# Patient Record
Sex: Female | Born: 1990 | Race: Black or African American | Hispanic: No | Marital: Married | Smoking: Never smoker
Health system: Southern US, Community
[De-identification: ages and names within clinical notes are randomized; demographics above are authoritative.]

## PROBLEM LIST (undated history)

## (undated) ENCOUNTER — Inpatient Hospital Stay (HOSPITAL_COMMUNITY): Payer: Self-pay

## (undated) DIAGNOSIS — M84376A Stress fracture, unspecified foot, initial encounter for fracture: Secondary | ICD-10-CM

## (undated) HISTORY — PX: WISDOM TOOTH EXTRACTION: SHX21

## (undated) HISTORY — DX: Stress fracture, unspecified foot, initial encounter for fracture: M84.376A

---
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
## 2010-03-02 DIAGNOSIS — M84376A Stress fracture, unspecified foot, initial encounter for fracture: Secondary | ICD-10-CM

## 2010-03-02 HISTORY — DX: Stress fracture, unspecified foot, initial encounter for fracture: M84.376A

## 2010-03-23 ENCOUNTER — Encounter: Payer: Self-pay | Admitting: Family Medicine

## 2010-10-30 ENCOUNTER — Emergency Department (HOSPITAL_COMMUNITY)
Admission: EM | Admit: 2010-10-30 | Discharge: 2010-10-30 | Disposition: A | Discharge status: Home / Self Care | Payer: Self-pay | Attending: Emergency Medicine | Admitting: Emergency Medicine

## 2010-10-30 DIAGNOSIS — I10 Essential (primary) hypertension: Secondary | ICD-10-CM | POA: Insufficient documentation

## 2010-10-30 DIAGNOSIS — L298 Other pruritus: Secondary | ICD-10-CM | POA: Insufficient documentation

## 2010-10-30 DIAGNOSIS — L509 Urticaria, unspecified: Secondary | ICD-10-CM | POA: Insufficient documentation

## 2010-10-30 DIAGNOSIS — T7840XA Allergy, unspecified, initial encounter: Secondary | ICD-10-CM | POA: Insufficient documentation

## 2010-10-30 DIAGNOSIS — L2989 Other pruritus: Secondary | ICD-10-CM | POA: Insufficient documentation

## 2011-08-02 ENCOUNTER — Emergency Department (HOSPITAL_COMMUNITY)
Admission: EM | Admit: 2011-08-02 | Discharge: 2011-08-02 | Disposition: A | Discharge status: Home / Self Care | Payer: Self-pay | Source: Home / Self Care | Attending: Emergency Medicine | Admitting: Emergency Medicine

## 2011-08-02 ENCOUNTER — Encounter (HOSPITAL_COMMUNITY): Payer: Self-pay

## 2011-08-02 DIAGNOSIS — T148XXA Other injury of unspecified body region, initial encounter: Secondary | ICD-10-CM

## 2011-08-02 MED ORDER — IBUPROFEN 800 MG PO TABS
ORAL_TABLET | ORAL | Status: AC
  Filled 2011-08-02: qty 1

## 2011-08-02 MED ORDER — IBUPROFEN 800 MG PO TABS: 800.0000 mg | ORAL_TABLET | Freq: Once | ORAL | Status: DC

## 2011-08-02 MED ORDER — IBUPROFEN 800 MG PO TABS: 800.0000 mg | ORAL_TABLET | Freq: Three times a day (TID) | ORAL | Status: AC

## 2011-08-02 NOTE — ED Provider Notes (Signed)
History     CSN: [id number]  Arrival date & time 08/02/11  1859   First MD Initiated Contact with Patient 08/02/11 1952      Chief Complaint  Patient presents with  . Muscle Pain    (Consider location/radiation/quality/duration/timing/severity/associated sxs/prior treatment) HPI Comments: Pt was dancing at church today, afterward, L chest began hurting.  Hurts if she takes a deep breath or pushes on the area.  Reports pain feels like it is on the outside, not inside her chest  Patient is a 21 y.o. female presenting with musculoskeletal pain. The history is provided by the patient.  Muscle Pain This is a new problem. The current episode started 6 to 12 hours ago. The problem occurs constantly. The problem has not changed since onset.Associated symptoms include chest pain. Pertinent negatives include no shortness of breath. The symptoms are aggravated by exertion (deep breathing, touching the area). The symptoms are relieved by nothing. She has tried nothing for the symptoms.    History reviewed. No pertinent past medical history.  History reviewed. No pertinent past surgical history.  History reviewed. No pertinent family history.  History  Substance Use Topics  . Smoking status: Never Smoker   . Smokeless tobacco: Not on file  . Alcohol Use: Yes    OB History    Grav Para Term Preterm Abortions TAB SAB Ect Mult Living                  Review of Systems  Constitutional: Negative for fever and chills.  Respiratory: Negative for cough, chest tightness, shortness of breath and wheezing.   Cardiovascular: Positive for chest pain.  Musculoskeletal:       Muscle chest pain    Allergies  Review of patient's allergies indicates no known allergies.  Home Medications   Current Outpatient Rx  Name Route Sig Dispense Refill  . IBUPROFEN 800 MG PO TABS Oral Take 1 tablet (800 mg total) by mouth 3 (three) times daily. 21 tablet 0    BP 121/71  Pulse 70  Temp(Src) 98.6 F  (37 C) (Oral)  Resp 18  SpO2 97%  LMP 07/26/2011  Physical Exam  Constitutional: She appears well-developed and well-nourished. No distress.  Cardiovascular: Normal rate and regular rhythm.   Pulmonary/Chest: Effort normal and breath sounds normal. She exhibits tenderness.      ED Course  Procedures (including critical care time)  Labs Reviewed - No data to display No results found.   1. Muscle strain       MDM          Cathlyn Parsons, NP 08/02/11 1955

## 2011-08-02 NOTE — ED Notes (Signed)
Pt states she was dancing at church today and now is having rt sided chest discomfort when stretching or moving.

## 2011-08-02 NOTE — Discharge Instructions (Signed)
Try soaking in a warm bath with epsom salt or making a warm compress with warm water and epsom salt at least 2 times a day to help relieve the pain and swelling.  Do gentle stretches until the muscle heals.   Muscle Strain A muscle strain (pulled muscle) happens when a muscle is over-stretched. Recovery usually takes 5 to 6 weeks.  HOME CARE   Put ice on the injured area.   Put ice in a plastic bag.   Place a towel between your skin and the bag.   Leave the ice on for 15 to 20 minutes at a time, every hour for the first 2 days.   Do not use the muscle for several days or until your doctor says you can. Do not use the muscle if you have pain.   Wrap the injured area with an elastic bandage for comfort. Do not put it on too tightly.   Only take medicine as told by your doctor.   Warm up before exercise. This helps prevent muscle strains.  GET HELP RIGHT AWAY IF:  There is increased pain or puffiness (swelling) in the affected area. MAKE SURE YOU:   Understand these instructions.   Will watch your condition.   Will get help right away if you are not doing well or get worse.  Document Released: 11/26/2007 Document Revised: 02/05/2011 Document Reviewed: 11/26/2007 [facility] Patient Information 2012 [place], [place].

## 2011-08-02 NOTE — ED Provider Notes (Signed)
Medical screening examination/treatment/procedure(s) were performed by non-physician practitioner and as supervising physician I was immediately available for consultation/collaboration.  Leslee Home, M.D.   Reuben Likes, MD 08/02/11 2033

## 2012-02-01 ENCOUNTER — Other Ambulatory Visit (HOSPITAL_COMMUNITY)
Admission: RE | Admit: 2012-02-01 | Discharge: 2012-02-01 | Disposition: A | Discharge status: Home / Self Care | Payer: BC Managed Care – PPO | Source: Ambulatory Visit | Attending: Medical | Admitting: Medical

## 2012-02-01 ENCOUNTER — Ambulatory Visit (INDEPENDENT_AMBULATORY_CARE_PROVIDER_SITE_OTHER): Payer: BC Managed Care – PPO | Admitting: Medical

## 2012-02-01 ENCOUNTER — Encounter: Payer: Self-pay | Admitting: Medical

## 2012-02-01 VITALS — BP 112/70 | HR 68 | Temp 98.3°F | Resp 16 | Ht 67.0 in | Wt 194.0 lb

## 2012-02-01 DIAGNOSIS — IMO0002 Reserved for concepts with insufficient information to code with codable children: Secondary | ICD-10-CM

## 2012-02-01 DIAGNOSIS — Z01419 Encounter for gynecological examination (general) (routine) without abnormal findings: Secondary | ICD-10-CM | POA: Insufficient documentation

## 2012-02-01 DIAGNOSIS — Z3201 Encounter for pregnancy test, result positive: Secondary | ICD-10-CM

## 2012-02-01 DIAGNOSIS — R6889 Other general symptoms and signs: Secondary | ICD-10-CM

## 2012-02-01 DIAGNOSIS — Z0000 Encounter for general adult medical examination without abnormal findings: Secondary | ICD-10-CM

## 2012-02-01 DIAGNOSIS — Z113 Encounter for screening for infections with a predominantly sexual mode of transmission: Secondary | ICD-10-CM

## 2012-02-01 DIAGNOSIS — Z124 Encounter for screening for malignant neoplasm of cervix: Secondary | ICD-10-CM

## 2012-02-01 DIAGNOSIS — N760 Acute vaginitis: Secondary | ICD-10-CM | POA: Insufficient documentation

## 2012-02-01 LAB — CBC WITH DIFFERENTIAL/PLATELET
Basophils Absolute: 0 10*3/uL (ref 0.0–0.1)
Basophils Relative: 1 % (ref 0–1)
Eosinophils Absolute: 0.2 10*3/uL (ref 0.0–0.7)
Eosinophils Relative: 2 % (ref 0–5)
HCT: 41.1 % (ref 36.0–46.0)
Hemoglobin: 13.9 g/dL (ref 12.0–15.0)
MCHC: 33.8 g/dL (ref 30.0–36.0)
MCV: 89.2 fL (ref 78.0–100.0)
Monocytes Relative: 8 % (ref 3–12)
Platelets: 302 10*3/uL (ref 150–400)

## 2012-02-01 LAB — POCT URINALYSIS DIPSTICK
Bilirubin, UA: NEGATIVE
Blood, UA: NEGATIVE
Leukocytes, UA: NEGATIVE
Protein, UA: NEGATIVE
Spec Grav, UA: 1.01
Urobilinogen, UA: NEGATIVE

## 2012-02-01 LAB — RPR

## 2012-02-01 NOTE — Progress Notes (Signed)
Subjective:   HPI  Kelly Koch is a 21 y.o. female who presents for a complete physical.  New patient today.  Was seeing different primary care prior, wanted to change practices.  Preventative care: Last ophthalmology visit: last visit a few years ago, doesn't wear corrective lenses Last dental visit:yes, regularly Last colonoscopy:n/a Last mammogram:n/a Last gynecological exam:05/2010, last pap 05/2010, abnormal, didn't f/u due to fear of the unknown Last EKG:n/a Last labs:n/a  Prior vaccinations: TD or Tdap:2011 Influenza: no Pneumococcal:n/a Shingles/Zostavax:n/a  Concerns: She notes that she missed her period in November.  Not on birth control.  Was on OCP tablets prior, but stopped in July 2012.  Stopped as she wasn't sexually active at that time.  Sexually active currently.   Uses condoms some times.  Been with current partner x 2 years.   No prior pregnancies.  No hx/o STDs.  No prior STD screening.  Has had 2 sexual partners total.   LMP 12/10/2011, 2 home pregnancy tests recently not clear, seemed positive but not sure.  She doesn't think she is ready to have a child at this time.  Not sure she would want to complete the pregnancy.  Reviewed their medical, surgical, family, social, medication, and allergy history and updated chart as appropriate.   Past Medical History  Diagnosis Date  . Stress fracture of foot 2012    left; healed with use of boot, no cast    Past Surgical History  Procedure Date  . Wisdom tooth extraction     Family History  Problem Relation Age of Onset  . Cancer Father 64    died of cancer; lung?  . Cancer Maternal Grandmother     lung  . Diabetes Neg Hx   . Heart disease Neg Hx   . Stroke Neg Hx   . Hypertension Neg Hx   . Hyperlipidemia Neg Hx     History   Social History  . Marital Status: Single    Spouse Name: N/A    Number of Children: N/A  . Years of Education: N/A   Occupational History  . Not on file.   Social  History Main Topics  . Smoking status: Never Smoker   . Smokeless tobacco: Not on file  . Alcohol Use: Yes     Comment: occasional  . Drug Use: No  . Sexually Active:    Other Topics Concern  . 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  Lives with mother and sister and step-father    No current outpatient prescriptions on file prior to visit.    No Known Allergies   Review of Systems Constitutional: -fever, -chills, -sweats, +unexpected weight change, has gained some weight in the last few months, -decreased appetite, +fatigue Allergy: -sneezing, -itching, -congestion Dermatology: -changing moles, --rash, -lumps ENT: -runny nose, -ear pain, -sore throat, -hoarseness, -sinus pain, -teeth pain, - ringing in ears, -hearing loss, -nosebleeds Cardiology: -chest pain, -palpitations, -swelling, -difficulty breathing when lying flat, -waking up short of breath Respiratory: -cough, -shortness of breath, -difficulty breathing with exercise or exertion, -wheezing, -coughing up blood Gastroenterology: -abdominal pain, -nausea, -vomiting, -diarrhea, -constipation, -blood in stool, -changes in bowel movement, -difficulty swallowing or eating Hematology: -bleeding, -bruising  Musculoskeletal: +joint aches, -muscle aches, -joint swelling, -back pain, -neck pain, -cramping, -changes in gait Ophthalmology: denies vision changes, eye redness, itching, discharge Urology: -burning with urination, -difficulty urinating, -blood in  urine, -urinary frequency, -urgency, -incontinence Neurology: -headache, -weakness, -tingling, -numbness, -memory loss, -falls, -dizziness Psychology: -depressed mood, -agitation, -sleep problems     Objective:   Physical Exam  Nurse notes and vitals reviewed.  General appearance: alert, no distress, WD/WN, AA female Skin: few scattered benign appearing macules, no worrisome  lesions HEENT: normocephalic, conjunctiva/corneas normal, sclerae anicteric, PERRLA, EOMi, nares patent, no discharge or erythema, pharynx normal Oral cavity: MMM, tongue normal, teeth in good repair Neck: supple, no lymphadenopathy, no thyromegaly, no masses, normal ROM, no bruits Chest: non tender, normal shape and expansion Heart: RRR, normal S1, S2, no murmurs Lungs: CTA bilaterally, no wheezes, rhonchi, or rales Abdomen: +bs, soft, non tender, non distended, no masses, no hepatomegaly, no splenomegaly, no bruits Back: non tender, normal ROM, no scoliosis Musculoskeletal: upper extremities non tender, no obvious deformity, normal ROM throughout, lower extremities non tender, no obvious deformity, normal ROM throughout Extremities: no edema, no cyanosis, no clubbing Pulses: 2+ symmetric, upper and lower extremities, normal cap refill Neurological: alert, oriented x 3, CN2-12 intact, strength normal upper extremities and lower extremities, sensation normal throughout, DTRs 2+ throughout, no cerebellar signs, gait normal Psychiatric: normal affect, behavior normal, pleasant  Breast: nontender, glandular fibrocystic tissue in lower bilat breast, no skin changes, no nipple discharge or inversion, no axillary lymphadenopathy Gyn: Normal external genitalia without lesions, vagina with normal mucosa, cervix with mild erythema and somewhat yellowish round tissue just superior to os, os closed, no cervical motion tenderness, no abnormal vaginal discharge.  Possible palpable adnexa on the right, uterus seems somewhat enlarged.  Pap performed with sterile gloves.  Exam chaperoned by nurse. Rectal: deferred    Assessment and Plan :    Encounter Diagnoses  Name Primary?  . Routine general medical examination at a health care facility Yes  . Positive urine pregnancy test   . Abnormal Pap smear   . Screening for STD (sexually transmitted disease)   . Screening for cervical cancer     Physical exam  - discussed healthy lifestyle, diet, exercise, preventative care, vaccinations, and addressed their concerns.  Handout given.  Declines flu vaccine.  She did complete HPV vaccine series by age 31yo.   Pap and STD screening done today.  She is nonfasting, so lipid not done.  Discussed +urine pregnancy test.  Additional labs today.  discussed options going forward. Advised she consider all options, and we will call with labs and pap results.  Will likely refer to [facility], but await results.

## 2012-02-02 LAB — COMPREHENSIVE METABOLIC PANEL
Albumin: 4.3 g/dL (ref 3.5–5.2)
Alkaline Phosphatase: 55 U/L (ref 39–117)
BUN: 7 mg/dL (ref 6–23)
CO2: 24 mEq/L (ref 19–32)
Glucose, Bld: 85 mg/dL (ref 70–99)
Sodium: 137 mEq/L (ref 135–145)
Total Bilirubin: 0.4 mg/dL (ref 0.3–1.2)
Total Protein: 6.6 g/dL (ref 6.0–8.3)

## 2012-02-02 LAB — TSH: TSH: 1.811 u[IU]/mL (ref 0.350–4.500)

## 2012-03-02 NOTE — L&D Delivery Note (Signed)
After admission the pt was started on Pitocin. She had little response to this and it was stopped. She was given 3 Cytotecs and then restarted on Pitocin. Once in the active phase she progressed along a normal labor curve. She pushed for 2 hours. While pushing she developed a fever which was txd with Tylenol and Ampicillin. The baby was OP. It was manually rotated to OA and then delivered with the second contraction over an intact perineum. Baby was delivered ROA. Placenta S/I. EBL-400cc. Baby to NBN.

## 2012-03-03 ENCOUNTER — Telehealth: Payer: Self-pay | Admitting: Internal Medicine

## 2012-03-04 ENCOUNTER — Encounter: Payer: Self-pay | Admitting: Medical

## 2012-03-04 NOTE — Telephone Encounter (Signed)
Patient came by the office and pick up her letter. I called over to [facility] to set the appointment and Rep. York Spaniel that they will need to verify her pregnancy insurance benefits and then they will call her to schedule the appointment. CLS

## 2012-03-04 NOTE — Telephone Encounter (Signed)
Kelly Koch - pls send her the letter.  i printed it up front.   pls refer ASAP to gyn wherever you can get her in the soonest.  Let them know that she was originally unsure whether she was going to proceed with the pregnancy.  I don't know what her plans are at this time.   I know she is behind in prenatal care though.

## 2012-04-23 ENCOUNTER — Inpatient Hospital Stay (HOSPITAL_COMMUNITY)
Admission: AD | Admit: 2012-04-23 | Discharge: 2012-04-23 | Disposition: A | Discharge status: Home / Self Care | Payer: BC Managed Care – PPO | Source: Ambulatory Visit | Attending: Family Medicine | Admitting: Family Medicine

## 2012-04-23 ENCOUNTER — Encounter (HOSPITAL_COMMUNITY): Payer: Self-pay | Admitting: Obstetrics and Gynecology

## 2012-04-23 DIAGNOSIS — R109 Unspecified abdominal pain: Secondary | ICD-10-CM | POA: Insufficient documentation

## 2012-04-23 DIAGNOSIS — O219 Vomiting of pregnancy, unspecified: Secondary | ICD-10-CM

## 2012-04-23 DIAGNOSIS — N949 Unspecified condition associated with female genital organs and menstrual cycle: Secondary | ICD-10-CM

## 2012-04-23 DIAGNOSIS — O210 Mild hyperemesis gravidarum: Secondary | ICD-10-CM | POA: Insufficient documentation

## 2012-04-23 LAB — URINALYSIS, ROUTINE W REFLEX MICROSCOPIC
Bilirubin Urine: NEGATIVE
Hgb urine dipstick: NEGATIVE
Leukocytes, UA: NEGATIVE
Nitrite: NEGATIVE
Specific Gravity, Urine: >1.03 — ABNORMAL HIGH (ref 1.005–1.030)
pH: 5.5 (ref 5.0–8.0)

## 2012-04-23 MED ORDER — PROMETHAZINE HCL 12.5 MG PO TABS: 12.5000 mg | ORAL_TABLET | Freq: Four times a day (QID) | ORAL | Status: DC | PRN

## 2012-04-23 MED ORDER — ONDANSETRON 8 MG PO TBDP
8.0000 mg | ORAL_TABLET | Freq: Once | ORAL | Status: AC
  Administered 2012-04-23: 8 mg via ORAL
  Filled 2012-04-23: qty 1

## 2012-04-23 NOTE — MAU Provider Note (Signed)
History     CSN: [id number]  Arrival date and time: 04/23/12 1139   First Provider Initiated Contact with Patient 04/23/12 1235      Chief Complaint  Patient presents with  . Nausea   HPI Kelly Koch is 22 y.o. G1P0 [redacted]w[redacted]d weeks presenting with persistent nausea.  Had vomiting 5 days ago but none since, nausea continues.  Denies vaginal bleeding or discharge.  Medicaid has been approved, waiting for card to begin care with [facility].         Is having intermittent discomfort on the "sides" with movement.  Is sitting in bed eating crackers with vomiting.     Past Medical History  Diagnosis Date  . Stress fracture of foot 2012    left; healed with use of boot, no cast    Past Surgical History  Procedure Laterality Date  . Wisdom tooth extraction      Family History  Problem Relation Age of Onset  . Cancer Father 60    died of cancer; lung?  . Cancer Maternal Grandmother     lung  . Diabetes Neg Hx   . Heart disease Neg Hx   . Stroke Neg Hx   . Hypertension Neg Hx   . Hyperlipidemia Neg Hx     History  Substance Use Topics  . Smoking status: Never Smoker   . Smokeless tobacco: Never Used  . Alcohol Use: Yes     Comment: occasional    Allergies: No Known Allergies  No prescriptions prior to admission    Review of Systems  Constitutional: Negative.   HENT: Negative.   Respiratory: Negative.   Cardiovascular: Negative.   Gastrointestinal: Positive for nausea and abdominal pain (on her sides).  Genitourinary: Negative.        Neg for vaginal bleeding or discharge   Physical Exam   Blood pressure 131/75, pulse 97, temperature 98.2 F (36.8 C), temperature source Oral, resp. rate 18, height 5\' 6"  (1.676 m), weight 196 lb (88.905 kg), last menstrual period 12/10/2011.  Physical Exam  Constitutional: She is oriented to person, place, and time. She appears well-developed and well-nourished. No distress.  HENT:  Head: Normocephalic.  Neck: Normal range  of motion.  Cardiovascular: Normal rate.   Respiratory: Effort normal.  GI: There is tenderness (area of round ligaments).  Fetal heart tones dopplered at 150  Genitourinary:  Not indicated  Neurological: She is alert and oriented to person, place, and time.  Skin: Skin is warm and dry.  Psychiatric: She has a normal mood and affect. Her behavior is normal.   Results for orders placed during the hospital encounter of 04/23/12 (from the past 24 hour(s))  URINALYSIS, ROUTINE W REFLEX MICROSCOPIC     Status: Abnormal   Collection Time    04/23/12 12:00 PM      Result Value Range   Color, Urine YELLOW  YELLOW   APPearance CLEAR  CLEAR   Specific Gravity, Urine >1.030 (*) 1.005 - 1.030   pH 5.5  5.0 - 8.0   Glucose, UA NEGATIVE  NEGATIVE mg/dL   Hgb urine dipstick NEGATIVE  NEGATIVE   Bilirubin Urine NEGATIVE  NEGATIVE   Ketones, ur NEGATIVE  NEGATIVE mg/dL   Protein, ur NEGATIVE  NEGATIVE mg/dL   Urobilinogen, UA 0.2  0.0 - 1.0 mg/dL   Nitrite NEGATIVE  NEGATIVE   Leukocytes, UA NEGATIVE  NEGATIVE    MAU Course  Procedures  MDM Zofran 8mg  ODT given in MAU  Assessment  and Plan  A:  Nausea in pregnancy    Round ligament pain  P:  Rx for Phenergan 12.5mg  tabs      Begin prenatal care as planned with [facility] when card available      BRAT diet    Tylenol prn for round ligament pain KEY,EVE M 04/23/2012, 12:36 PM

## 2012-04-23 NOTE — MAU Provider Note (Signed)
Chart reviewed and agree with management and plan.  

## 2012-04-23 NOTE — MAU Note (Addendum)
Patient presents to MAU with c/o N/V. Patient reports last time she threw up was on Monday. She is waiting for Medicaid pregnancy verification and is planning to then be seen by [facility].  Denies vaginal bleeding, cramping, or LOF.

## 2012-04-23 NOTE — MAU Note (Signed)
Kelly Koch is a G1 at 19 weeks, no prenatal care. She has applied to medicaid, just waiting on her card. She is having a lot of nausea with some abdominal cramping. She is not having any pain now.

## 2012-05-13 ENCOUNTER — Other Ambulatory Visit: Payer: Self-pay

## 2012-08-19 ENCOUNTER — Emergency Department (HOSPITAL_COMMUNITY)
Admission: EM | Admit: 2012-08-19 | Discharge: 2012-08-19 | Disposition: A | Discharge status: Home / Self Care | Payer: Medicaid Other | Source: Home / Self Care | Attending: Emergency Medicine | Admitting: Emergency Medicine

## 2012-08-19 ENCOUNTER — Encounter (HOSPITAL_COMMUNITY): Payer: Self-pay | Admitting: Emergency Medicine

## 2012-08-19 ENCOUNTER — Other Ambulatory Visit (HOSPITAL_COMMUNITY)
Admission: RE | Admit: 2012-08-19 | Discharge: 2012-08-19 | Disposition: A | Discharge status: Home / Self Care | Payer: BC Managed Care – PPO | Source: Ambulatory Visit | Attending: Emergency Medicine | Admitting: Emergency Medicine

## 2012-08-19 DIAGNOSIS — L909 Atrophic disorder of skin, unspecified: Secondary | ICD-10-CM

## 2012-08-19 DIAGNOSIS — L089 Local infection of the skin and subcutaneous tissue, unspecified: Secondary | ICD-10-CM

## 2012-08-19 DIAGNOSIS — L918 Other hypertrophic disorders of the skin: Secondary | ICD-10-CM

## 2012-08-19 DIAGNOSIS — L919 Hypertrophic disorder of the skin, unspecified: Secondary | ICD-10-CM | POA: Insufficient documentation

## 2012-08-19 NOTE — ED Provider Notes (Signed)
Chief Complaint:   Chief Complaint  Patient presents with  . Mass    History of Present Illness:   Kelly Koch is a 22 year old female [redacted] weeks pregnant. She has had a small lesion on her left medial thigh for months. She may have irritated it has gotten larger in size and slightly painful over the past day.  Review of Systems:  Other than noted above, the patient denies any of the following symptoms: Systemic:  No fever, chills, sweats, weight loss, or fatigue. ENT:  No nasal congestion, rhinorrhea, sore throat, swelling of lips, tongue or throat. Resp:  No cough, wheezing, or shortness of breath. Skin:  No rash, itching, nodules, or suspicious lesions.  PMFSH:  Past medical history, family history, social history, meds, and allergies were reviewed.   Physical Exam:   Vital signs:  BP 127/74  Pulse 80  Temp(Src) 98.4 F (36.9 C) (Oral)  Resp 16  SpO2 100%  LMP 12/10/2011 Gen:  Alert, oriented, in no distress. ENT:  Pharynx clear, no intraoral lesions, moist mucous membranes. Lungs:  Clear to auscultation. Skin:  There is a swollen polypoid lesion measuring 4 mm in size on the medial left thigh. This was not erythematous and not draining any pus.  Procedure Note:  Verbal informed consent was obtained from the patient.  Risks and benefits were outlined with the patient.  Patient understands and accepts these risks.  Identity of the patient was confirmed verbally and by armband.    Procedure was performed as follows:  The lesion was prepped with Betadine and alcohol and anesthetized with 1 mL of 2% Xylocaine with epinephrine. The lesion was then snipped off even with the skin and the base was cauterized with electrocautery pad. The specimen was placed in a formalin jar and sent for surgical pathology.  Patient tolerated the procedure well without any immediate complications.   Assessment:  The encounter diagnosis was Inflamed skin tag.  Plan:   1.  The following meds  were prescribed:   Discharge Medication List as of 08/19/2012  5:02 PM     2.  The patient was instructed in symptomatic care and handouts were given. 3.  The patient was told to return if becoming worse in any way, if no better in 3 or 4 days, and given some red flag symptoms such as any sign of infection that would indicate earlier return. 4.  Follow up here if necessary.     Reuben Likes, MD 08/19/12 2051

## 2012-08-19 NOTE — ED Notes (Signed)
At bedside with dr Lorenz Coaster, removed tissue form left inner thigh. Tissue placed in biopsy cup, labeled by this nurse and notified celeste.

## 2012-08-19 NOTE — ED Notes (Signed)
Patient has a bump or large skin tag on inner left thigh.  Patient had noticed this several months ago, but today noticed the increase in size.

## 2012-09-08 ENCOUNTER — Inpatient Hospital Stay (HOSPITAL_COMMUNITY)
Admission: AD | Admit: 2012-09-08 | Discharge: 2012-09-11 | DRG: 372 | Disposition: A | Discharge status: Home / Self Care | Payer: BC Managed Care – PPO | Source: Ambulatory Visit | Attending: Obstetrics & Gynecology | Admitting: Obstetrics & Gynecology

## 2012-09-08 ENCOUNTER — Encounter (HOSPITAL_COMMUNITY): Payer: Self-pay | Admitting: *Deleted

## 2012-09-08 DIAGNOSIS — O4290 Premature rupture of membranes, unspecified as to length of time between rupture and onset of labor, unspecified weeks of gestation: Principal | ICD-10-CM | POA: Diagnosis present

## 2012-09-08 DIAGNOSIS — Z3480 Encounter for supervision of other normal pregnancy, unspecified trimester: Secondary | ICD-10-CM

## 2012-09-08 LAB — CBC
MCH: 29.9 pg (ref 26.0–34.0)
MCHC: 34.9 g/dL (ref 30.0–36.0)
Platelets: 242 10*3/uL (ref 150–400)

## 2012-09-08 LAB — RPR: RPR Ser Ql: NONREACTIVE

## 2012-09-08 MED ORDER — OXYTOCIN 40 UNITS IN LACTATED RINGERS INFUSION - SIMPLE MED
1.0000 m[IU]/min | INTRAVENOUS | Status: DC
  Administered 2012-09-08: 2 m[IU]/min via INTRAVENOUS
  Filled 2012-09-08: qty 1000

## 2012-09-08 MED ORDER — TERBUTALINE SULFATE 1 MG/ML IJ SOLN: 0.2500 mg | Freq: Once | INTRAMUSCULAR | Status: AC | PRN

## 2012-09-08 MED ORDER — OXYTOCIN 40 UNITS IN LACTATED RINGERS INFUSION - SIMPLE MED
62.5000 mL/h | INTRAVENOUS | Status: DC
  Filled 2012-09-08: qty 1000

## 2012-09-08 MED ORDER — OXYTOCIN BOLUS FROM INFUSION: 500.0000 mL | INTRAVENOUS | Status: DC

## 2012-09-08 MED ORDER — LACTATED RINGERS IV SOLN
500.0000 mL | INTRAVENOUS | Status: DC | PRN
  Administered 2012-09-09 (×2): 500 mL via INTRAVENOUS

## 2012-09-08 MED ORDER — IBUPROFEN 600 MG PO TABS: 600.0000 mg | ORAL_TABLET | Freq: Four times a day (QID) | ORAL | Status: DC | PRN

## 2012-09-08 MED ORDER — ONDANSETRON HCL 4 MG/2ML IJ SOLN: 4.0000 mg | Freq: Four times a day (QID) | INTRAMUSCULAR | Status: DC | PRN

## 2012-09-08 MED ORDER — FLEET ENEMA 7-19 GM/118ML RE ENEM: 1.0000 | ENEMA | RECTAL | Status: DC | PRN

## 2012-09-08 MED ORDER — MISOPROSTOL 25 MCG QUARTER TABLET
25.0000 ug | ORAL_TABLET | ORAL | Status: DC | PRN
  Administered 2012-09-08 (×2): 25 ug via VAGINAL
  Filled 2012-09-08: qty 1
  Filled 2012-09-08 (×2): qty 0.25

## 2012-09-08 MED ORDER — ACETAMINOPHEN 325 MG PO TABS
650.0000 mg | ORAL_TABLET | ORAL | Status: DC | PRN
  Administered 2012-09-09: 650 mg via ORAL
  Filled 2012-09-08: qty 2

## 2012-09-08 MED ORDER — BUTORPHANOL TARTRATE 1 MG/ML IJ SOLN
1.0000 mg | INTRAMUSCULAR | Status: DC | PRN
Start: 2012-09-08 — End: 2012-09-09

## 2012-09-08 MED ORDER — LIDOCAINE HCL (PF) 1 % IJ SOLN
30.0000 mL | INTRAMUSCULAR | Status: DC | PRN
  Filled 2012-09-08 (×2): qty 30

## 2012-09-08 MED ORDER — OXYCODONE-ACETAMINOPHEN 5-325 MG PO TABS: 1.0000 | ORAL_TABLET | ORAL | Status: DC | PRN

## 2012-09-08 MED ORDER — LACTATED RINGERS IV SOLN
INTRAVENOUS | Status: DC
  Administered 2012-09-09 (×3): via INTRAVENOUS

## 2012-09-08 MED ORDER — CITRIC ACID-SODIUM CITRATE 334-500 MG/5ML PO SOLN: 30.0000 mL | ORAL | Status: DC | PRN

## 2012-09-08 NOTE — MAU Note (Signed)
Pt reports she felt her water break around 1230. Clear fluid noted. Having some back cramping and pain good fetal movement reported.

## 2012-09-09 ENCOUNTER — Encounter (HOSPITAL_COMMUNITY): Payer: Self-pay | Admitting: Anesthesiology

## 2012-09-09 ENCOUNTER — Inpatient Hospital Stay (HOSPITAL_COMMUNITY): Payer: BC Managed Care – PPO | Admitting: Anesthesiology

## 2012-09-09 MED ORDER — DIBUCAINE 1 % RE OINT: 1.0000 "application " | TOPICAL_OINTMENT | RECTAL | Status: DC | PRN

## 2012-09-09 MED ORDER — EPHEDRINE 5 MG/ML INJ
10.0000 mg | INTRAVENOUS | Status: DC | PRN
  Filled 2012-09-09: qty 4
  Filled 2012-09-09: qty 2

## 2012-09-09 MED ORDER — PHENYLEPHRINE 40 MCG/ML (10ML) SYRINGE FOR IV PUSH (FOR BLOOD PRESSURE SUPPORT)
80.0000 ug | PREFILLED_SYRINGE | INTRAVENOUS | Status: DC | PRN
  Filled 2012-09-09: qty 2

## 2012-09-09 MED ORDER — TETANUS-DIPHTH-ACELL PERTUSSIS 5-2.5-18.5 LF-MCG/0.5 IM SUSP: 0.5000 mL | Freq: Once | INTRAMUSCULAR | Status: DC

## 2012-09-09 MED ORDER — WITCH HAZEL-GLYCERIN EX PADS: 1.0000 "application " | MEDICATED_PAD | CUTANEOUS | Status: DC | PRN

## 2012-09-09 MED ORDER — PHENYLEPHRINE 40 MCG/ML (10ML) SYRINGE FOR IV PUSH (FOR BLOOD PRESSURE SUPPORT)
80.0000 ug | PREFILLED_SYRINGE | INTRAVENOUS | Status: DC | PRN
  Filled 2012-09-09: qty 5
  Filled 2012-09-09: qty 2

## 2012-09-09 MED ORDER — DIPHENHYDRAMINE HCL 50 MG/ML IJ SOLN
12.5000 mg | INTRAMUSCULAR | Status: AC | PRN
  Administered 2012-09-09 (×3): 12.5 mg via INTRAVENOUS
  Filled 2012-09-09 (×2): qty 1

## 2012-09-09 MED ORDER — SIMETHICONE 80 MG PO CHEW: 80.0000 mg | CHEWABLE_TABLET | ORAL | Status: DC | PRN

## 2012-09-09 MED ORDER — ZOLPIDEM TARTRATE 5 MG PO TABS: 5.0000 mg | ORAL_TABLET | Freq: Every evening | ORAL | Status: DC | PRN

## 2012-09-09 MED ORDER — IBUPROFEN 600 MG PO TABS
600.0000 mg | ORAL_TABLET | Freq: Four times a day (QID) | ORAL | Status: DC
  Administered 2012-09-09 – 2012-09-11 (×7): 600 mg via ORAL
  Filled 2012-09-09 (×5): qty 1

## 2012-09-09 MED ORDER — OXYTOCIN 40 UNITS IN LACTATED RINGERS INFUSION - SIMPLE MED
1.0000 m[IU]/min | INTRAVENOUS | Status: DC
  Administered 2012-09-09: 2 m[IU]/min via INTRAVENOUS
  Administered 2012-09-09: 12 m[IU]/min via INTRAVENOUS
  Administered 2012-09-09: 10 m[IU]/min via INTRAVENOUS

## 2012-09-09 MED ORDER — FENTANYL 2.5 MCG/ML BUPIVACAINE 1/10 % EPIDURAL INFUSION (WH - ANES)
14.0000 mL/h | INTRAMUSCULAR | Status: DC | PRN
  Administered 2012-09-09: 14 mL/h via EPIDURAL
  Filled 2012-09-09 (×2): qty 125

## 2012-09-09 MED ORDER — BENZOCAINE-MENTHOL 20-0.5 % EX AERO
1.0000 "application " | INHALATION_SPRAY | CUTANEOUS | Status: DC | PRN
  Administered 2012-09-10: 1 via TOPICAL
  Filled 2012-09-09: qty 56

## 2012-09-09 MED ORDER — LACTATED RINGERS IV SOLN
500.0000 mL | Freq: Once | INTRAVENOUS | Status: AC
  Administered 2012-09-09: 04:00:00 via INTRAVENOUS

## 2012-09-09 MED ORDER — SODIUM CHLORIDE 0.9 % IV SOLN
2.0000 g | Freq: Once | INTRAVENOUS | Status: AC
  Administered 2012-09-09: 2 g via INTRAVENOUS
  Filled 2012-09-09: qty 2000

## 2012-09-09 MED ORDER — LIDOCAINE HCL (PF) 1 % IJ SOLN
INTRAMUSCULAR | Status: DC | PRN
  Administered 2012-09-09 (×2): 4 mL

## 2012-09-09 MED ORDER — MEASLES, MUMPS & RUBELLA VAC ~~LOC~~ INJ: 0.5000 mL | INJECTION | Freq: Once | SUBCUTANEOUS | Status: DC

## 2012-09-09 MED ORDER — EPHEDRINE 5 MG/ML INJ
10.0000 mg | INTRAVENOUS | Status: DC | PRN
  Filled 2012-09-09: qty 2

## 2012-09-09 MED ORDER — ONDANSETRON HCL 4 MG PO TABS: 4.0000 mg | ORAL_TABLET | ORAL | Status: DC | PRN

## 2012-09-09 MED ORDER — OXYCODONE-ACETAMINOPHEN 5-325 MG PO TABS: 1.0000 | ORAL_TABLET | ORAL | Status: DC | PRN

## 2012-09-09 MED ORDER — ONDANSETRON HCL 4 MG/2ML IJ SOLN: 4.0000 mg | INTRAMUSCULAR | Status: DC | PRN

## 2012-09-09 MED ORDER — FENTANYL 2.5 MCG/ML BUPIVACAINE 1/10 % EPIDURAL INFUSION (WH - ANES)
INTRAMUSCULAR | Status: DC | PRN
  Administered 2012-09-09: 14 mL/h via EPIDURAL

## 2012-09-09 NOTE — Anesthesia Procedure Notes (Signed)
Epidural Patient location during procedure: OB Start time: 09/09/2012 4:39 AM  Staffing Anesthesiologist: Evona Westra A. Performed by: anesthesiologist   Preanesthetic Checklist Completed: patient identified, site marked, surgical consent, pre-op evaluation, timeout performed, IV checked, risks and benefits discussed and monitors and equipment checked  Epidural Patient position: sitting Prep: site prepped and draped and DuraPrep Patient monitoring: continuous pulse ox and blood pressure Approach: midline Injection technique: LOR air  Needle:  Needle type: Tuohy  Needle gauge: 17 G Needle length: 9 cm and 9 Needle insertion depth: 8 cm Catheter type: closed end flexible Catheter size: 19 Gauge Catheter at skin depth: 13 cm Test dose: negative  Assessment Events: blood not aspirated, injection not painful, no injection resistance, negative IV test and no paresthesia  Additional Notes Patient identified. Risks and benefits discussed including failed block, incomplete  Pain control, post dural puncture headache, nerve damage, paralysis, blood pressure Changes, nausea, vomiting, reactions to medications-both toxic and allergic and post Partum back pain. All questions were answered. Patient expressed understanding and wished to proceed. Sterile technique was used throughout procedure. Epidural site was Dressed with sterile barrier dressing. No paresthesias, signs of intravascular injection Or signs of intrathecal spread were encountered.  Patient was more comfortable after the epidural was dosed. Please see RN's note for documentation of vital signs and FHR which are stable.

## 2012-09-09 NOTE — Progress Notes (Signed)
Delivery of live viable female by Dr Anderson. APGARS 9,9  

## 2012-09-09 NOTE — Anesthesia Preprocedure Evaluation (Signed)
Anesthesia Evaluation  Patient identified by MRN, date of birth, ID band Patient awake    Reviewed: Allergy & Precautions, H&P , Patient's Chart, lab work & pertinent test results  Airway Mallampati: II TM Distance: >3 FB Neck ROM: full    Dental no notable dental hx. (+) Teeth Intact   Pulmonary neg pulmonary ROS,  breath sounds clear to auscultation  Pulmonary exam normal       Cardiovascular negative cardio ROS  Rhythm:regular Rate:Normal     Neuro/Psych negative neurological ROS  negative psych ROS   GI/Hepatic negative GI ROS, Neg liver ROS,   Endo/Other  Morbid obesity  Renal/GU negative Renal ROS  negative genitourinary   Musculoskeletal   Abdominal   Peds  Hematology negative hematology ROS (+)   Anesthesia Other Findings   Reproductive/Obstetrics (+) Pregnancy                           Anesthesia Physical Anesthesia Plan  ASA: III  Anesthesia Plan: Epidural   Post-op Pain Management:    Induction:   Airway Management Planned:   Additional Equipment:   Intra-op Plan:   Post-operative Plan:   Informed Consent: I have reviewed the patients History and Physical, chart, labs and discussed the procedure including the risks, benefits and alternatives for the proposed anesthesia with the patient or authorized representative who has indicated his/her understanding and acceptance.     Plan Discussed with: Anesthesiologist  Anesthesia Plan Comments:         Anesthesia Quick Evaluation

## 2012-09-09 NOTE — Progress Notes (Addendum)
  Dr Dareen Piano notified of pt pushing progress, length of time pushing, FHR tracing with change in baseline, and maternal temp. Order for anbx.

## 2012-09-10 LAB — CBC
MCH: 30.3 pg (ref 26.0–34.0)
MCV: 86.4 fL (ref 78.0–100.0)
Platelets: 193 10*3/uL (ref 150–400)
RBC: 3.17 MIL/uL — ABNORMAL LOW (ref 3.87–5.11)

## 2012-09-10 NOTE — Progress Notes (Signed)
Patient still considering if she would like TDAP  Vaccine. Information regarding vaccine given to patient.

## 2012-09-10 NOTE — Progress Notes (Signed)
PPD#1 Pt without complaints. Lochia-wnl Tmax 100.7 at delivery IMP/ stable PLAN/ rouitine care.

## 2012-09-10 NOTE — Anesthesia Postprocedure Evaluation (Signed)
Anesthesia Post Note  Patient: Kelly Koch  Procedure(s) Performed: * No procedures listed *  Anesthesia type: Epidural  Patient location: Mother/Baby  Post pain: Pain level controlled  Post assessment: Post-op Vital signs reviewed  Last Vitals:  Filed Vitals:   09/10/12 0230  BP: 145/84  Pulse: 93  Temp: 36.6 C  Resp: 18    Post vital signs: Reviewed  Level of consciousness: awake  Complications: No apparent anesthesia complications

## 2012-09-11 MED ORDER — IBUPROFEN 600 MG PO TABS: 600.0000 mg | ORAL_TABLET | Freq: Four times a day (QID) | ORAL | Status: DC

## 2012-09-11 NOTE — Discharge Summary (Signed)
Obstetric Discharge Summary Reason for Admission: rupture of membranes Prenatal Procedures: ultrasound Intrapartum Procedures: spontaneous vaginal delivery Postpartum Procedures: none Complications-Operative and Postpartum: none Hemoglobin  Date Value Range Status  09/10/2012 9.6* 12.0 - 15.0 g/dL Final     HCT  Date Value Range Status  09/10/2012 27.4* 36.0 - 46.0 % Final    Physical Exam:  General: alert Lochia: appropriate Uterine Fundus: firm   Discharge Diagnoses: Term Pregnancy-delivered and PROM x24 hours  Discharge Information: Date: 09/11/2012 Activity: pelvic rest Diet: routine Medications: PNV and Ibuprofen Condition: stable Instructions: refer to practice specific booklet Discharge to: home Follow-up Information   Follow up with Levi Aland, MD. Schedule an appointment as soon as possible for a visit in 4 weeks.   Contact information:   [address] [place] [address] [phone]       Newborn Data: Live born female  Birth Weight: 7 lb 13 oz (3544 g) APGAR: 9, 9  Home with mother.  Payslee Bateson E 09/11/2012, 10:08 AM

## 2012-09-11 NOTE — Progress Notes (Signed)
PPD#2 Pt without complaints. Baby will not be able to be discharged because mother had temp and peds is awaiting the results of a blood culture. VS- pt with a couple of mildly elevated B/P IMP/ stable PLAN/ Will discharge mother.             Follow B/P.

## 2012-09-12 LAB — TYPE AND SCREEN: Unit division: 0

## 2012-09-14 ENCOUNTER — Telehealth: Payer: Self-pay | Admitting: Family Medicine

## 2012-09-14 NOTE — Progress Notes (Signed)
Post discharge chart review completed.  

## 2012-09-14 NOTE — Telephone Encounter (Signed)
I called the patient and she said that she was doing fine and thanks for the call. CLS

## 2012-09-14 NOTE — Telephone Encounter (Signed)
Message copied by Janeice Robinson on Wed Sep 14, 2012  2:33 PM ------      Message from: Jac Canavan      Created: Sun Sep 11, 2012  9:40 PM       pls call and see how she is doing?  I saw that she delivered her baby.           ------
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
## 2013-07-18 ENCOUNTER — Emergency Department (INDEPENDENT_AMBULATORY_CARE_PROVIDER_SITE_OTHER): Payer: BC Managed Care – PPO

## 2013-07-18 ENCOUNTER — Emergency Department (INDEPENDENT_AMBULATORY_CARE_PROVIDER_SITE_OTHER)
Admission: EM | Admit: 2013-07-18 | Discharge: 2013-07-18 | Disposition: A | Discharge status: Home / Self Care | Payer: BC Managed Care – PPO | Source: Home / Self Care

## 2013-07-18 ENCOUNTER — Encounter (HOSPITAL_COMMUNITY): Payer: Self-pay | Admitting: Emergency Medicine

## 2013-07-18 DIAGNOSIS — IMO0002 Reserved for concepts with insufficient information to code with codable children: Secondary | ICD-10-CM

## 2013-07-18 DIAGNOSIS — X58XXXA Exposure to other specified factors, initial encounter: Secondary | ICD-10-CM

## 2013-07-18 DIAGNOSIS — S62629A Displaced fracture of medial phalanx of unspecified finger, initial encounter for closed fracture: Secondary | ICD-10-CM

## 2013-07-18 NOTE — ED Provider Notes (Signed)
CSN: [id number]     Arrival date & time 07/18/13  1846 History   First MD Initiated Contact with Patient 07/18/13 1920     Chief Complaint  Patient presents with  . swollen finger    (Consider location/radiation/quality/duration/timing/severity/associated sxs/prior Treatment) HPI Comments: Playing with child on floor and "bent " the left middle finger yesterday. Now with pain, swelling and discoloration.   Past Medical History  Diagnosis Date  . Stress fracture of foot 2012    left; healed with use of boot, no cast   Past Surgical History  Procedure Laterality Date  . Wisdom tooth extraction     Family History  Problem Relation Age of Onset  . Cancer Father 6645    died of cancer; lung?  . Cancer Maternal Grandmother     lung  . Diabetes Neg Hx   . Heart disease Neg Hx   . Stroke Neg Hx   . Hypertension Neg Hx   . Hyperlipidemia Neg Hx    History  Substance Use Topics  . Smoking status: Never Smoker   . Smokeless tobacco: Never Used  . Alcohol Use: No     Comment: occasional   OB History   Grav Para Term Preterm Abortions TAB SAB Ect Mult Living   1 1 1       1      Review of Systems  Constitutional: Negative for fever and activity change.  HENT: Negative.   Respiratory: Negative.   Cardiovascular: Negative.   Musculoskeletal:       As per HPI  Neurological: Negative.     Allergies  Review of patient's allergies indicates no known allergies.  Home Medications   Prior to Admission medications   Medication Sig Start Date End Date Taking? Authorizing Provider  ibuprofen (ADVIL,MOTRIN) 600 MG tablet Take 1 tablet (600 mg total) by mouth every 6 (six) hours. 09/11/12   Levi AlandMark E Anderson, MD  Prenatal Vit-Fe Fumarate-FA (PRENATAL MULTIVITAMIN) TABS Take 1 tablet by mouth daily at 12 noon.    Historical Provider, MD   BP 132/87  Pulse 79  Temp(Src) 98.7 F (37.1 C) (Oral)  Resp 14  SpO2 99%  LMP 06/25/2013 Physical Exam  Nursing note and vitals  reviewed. Constitutional: She is oriented to person, place, and time. She appears well-developed and well-nourished.  Neck: Normal range of motion. Neck supple.  Pulmonary/Chest: Effort normal. No respiratory distress.  Musculoskeletal:  Swelling to length of the L middle finger. Flexion approx 25% of nl. Full extension. Tenderness primarily to the middle phalange, DIP and distal phalange. Cap refill 2 sec. Sensory intact. Extension against resistance is nl.  Neurological: She is alert and oriented to person, place, and time.  Skin: Skin is warm and dry.  Psychiatric: She has a normal mood and affect.    ED Course  Procedures (including critical care time) Labs Review Labs Reviewed - No data to display  Imaging Review Dg Finger Middle Left  07/18/2013   CLINICAL DATA:  Pain and swelling secondary to trauma.  EXAM: LEFT MIDDLE FINGER 2+V  COMPARISON:  None.  FINDINGS: There is a spiral fracture through the middle phalanx of the left long finger. No angulation or significant displacement. The distal aspect of the fracture does extend into the DIP joint but not to the articular surface.  IMPRESSION: Fracture of the middle phalanx of the long finger.   Electronically Signed   By: Geanie CooleyJim  Maxwell M.D.   On: 07/18/2013 20:05  MDM   1. Fracture of finger, middle phalanx, left, closed     RICE Pt declined pain meds Finger splint F/U with Hand surgeon listed above, Call tomorrow for appt.    Hayden Rasmussenavid Sharan Mcenaney, NP 07/18/13 2017

## 2013-07-18 NOTE — ED Notes (Signed)
Patient complains of left middle finger swollen and black and blue Denies any injury but thinks she may have hit it on the floor while playing with her son

## 2013-07-18 NOTE — Discharge Instructions (Signed)
Finger Fracture Fractures of fingers are breaks in the bones of the fingers. There are many types of fractures. There are different ways of treating these fractures. Your health care provider will discuss the best way to treat your fracture. CAUSES Traumatic injury is the main cause of broken fingers. These include:  Injuries while playing sports.  Workplace injuries.  Falls. RISK FACTORS Activities that can increase your risk of finger fractures include:  Sports.  Workplace activities that involve machinery.  A condition called osteoporosis, which can make your bones less dense and cause them to fracture more easily. SIGNS AND SYMPTOMS The main symptoms of a broken finger are pain and swelling within 15 minutes after the injury. Other symptoms include:  Bruising of your finger.  Stiffness of your finger.  Numbness of your finger.  Exposed bones (compound fracture) if the fracture is severe. DIAGNOSIS  The best way to diagnose a broken bone is with X-ray imaging. Additionally, your health care provider will use this X-ray image to evaluate the position of the broken finger bones.  TREATMENT  Finger fractures can be treated with:   Nonreduction This means the bones are in place. The finger is splinted without changing the positions of the bone pieces. The splint is usually left on for about a week to 10 days. This will depend on your fracture and what your health care provider thinks.  Closed reduction The bones are put back into position without using surgery. The finger is then splinted.  Open reduction and internal fixation The fracture site is opened. Then the bone pieces are fixed into place with pins or some type of hardware. This is seldom required. It depends on the severity of the fracture. HOME CARE INSTRUCTIONS   Follow your health care provider's instructions regarding activities, exercises, and physical therapy.  Only take over-the-counter or prescription  medicines for pain, discomfort, or fever as directed by your health care provider. SEEK MEDICAL CARE IF: You have pain or swelling that limits the motion or use of your fingers. SEEK IMMEDIATE MEDICAL CARE IF:  Your finger becomes numb. MAKE SURE YOU:   Understand these instructions.  Will watch your condition.  Will get help right away if you are not doing well or get worse. Document Released: 05/31/2000 Document Revised: 12/07/2012 Document Reviewed: 09/28/2012 [facility] Patient Information 2014 [employer], [place].  Cast or Splint Care Casts and splints support injured limbs and keep bones from moving while they heal.  HOME CARE  Keep the cast or splint uncovered during the drying period.  A plaster cast can take 24 to 48 hours to dry.  A fiberglass cast will dry in less than 1 hour.  Do not rest the cast on anything harder than a pillow for 24 hours.  Do not put weight on your injured limb. Do not put pressure on the cast. Wait for your doctor's approval.  Keep the cast or splint dry.  Cover the cast or splint with a plastic bag during baths or wet weather.  If you have a cast over your chest and belly (trunk), take sponge baths until the cast is taken off.  If your cast gets wet, dry it with a towel or blow dryer. Use the cool setting on the blow dryer.  Keep your cast or splint clean. Wash a dirty cast with a damp cloth.  Do not put any objects under your cast or splint.  Do not scratch the skin under the cast with an object. If itching  is a problem, use a blow dryer on a cool setting over the itchy area.  Do not trim or cut your cast.  Do not take out the padding from inside your cast.  Exercise your joints near the cast as told by your doctor.  Raise (elevate) your injured limb on 1 or 2 pillows for the first 1 to 3 days. GET HELP IF:  Your cast or splint cracks.  Your cast or splint is too tight or too loose.  You itch badly under the cast.  Your cast  gets wet or has a soft spot.  You have a bad smell coming from the cast.  You get an object stuck under the cast.  Your skin around the cast becomes red or sore.  You have new or more pain after the cast is put on. GET HELP RIGHT AWAY IF:  You have fluid leaking through the cast.  You cannot move your fingers or toes.  Your fingers or toes turn blue or white or are cool, painful, or puffy (swollen).  You have tingling or lose feeling (numbness) around the injured area.  You have bad pain or pressure under the cast.  You have trouble breathing or have shortness of breath.  You have chest pain. Document Released: 06/18/2010 Document Revised: 10/19/2012 Document Reviewed: 08/25/2012 [facility] Patient Information 2014 [place], [place].

## 2013-07-20 NOTE — ED Provider Notes (Signed)
Medical screening examination/treatment/procedure(s) were performed by non-physician practitioner and as supervising physician I was immediately available for consultation/collaboration.  Baillie Mohammad, M.D.   Quynh Basso C Salmaan Patchin, MD 07/20/13 2234 

## 2014-01-01 ENCOUNTER — Encounter (HOSPITAL_COMMUNITY): Payer: Self-pay | Admitting: Emergency Medicine

## 2015-08-07 ENCOUNTER — Ambulatory Visit (HOSPITAL_COMMUNITY)
Admission: EM | Admit: 2015-08-07 | Discharge: 2015-08-07 | Disposition: A | Discharge status: Home / Self Care | Payer: No Typology Code available for payment source | Attending: Emergency Medicine | Admitting: Emergency Medicine

## 2015-08-07 ENCOUNTER — Encounter (HOSPITAL_COMMUNITY): Payer: Self-pay | Admitting: Emergency Medicine

## 2015-08-07 DIAGNOSIS — Z043 Encounter for examination and observation following other accident: Principal | ICD-10-CM

## 2015-08-07 DIAGNOSIS — S161XXA Strain of muscle, fascia and tendon at neck level, initial encounter: Secondary | ICD-10-CM

## 2015-08-07 DIAGNOSIS — Z041 Encounter for examination and observation following transport accident: Secondary | ICD-10-CM
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
## 2015-08-07 NOTE — Discharge Instructions (Signed)
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
## 2017-06-27 ENCOUNTER — Ambulatory Visit (HOSPITAL_COMMUNITY)
Admission: EM | Admit: 2017-06-27 | Discharge: 2017-06-27 | Disposition: A | Discharge status: Home / Self Care | Payer: BC Managed Care – PPO | Attending: Physician Assistant | Admitting: Physician Assistant

## 2017-06-27 ENCOUNTER — Encounter (HOSPITAL_COMMUNITY): Payer: Self-pay | Admitting: *Deleted

## 2017-06-27 ENCOUNTER — Other Ambulatory Visit: Payer: Self-pay

## 2017-06-27 DIAGNOSIS — R1013 Epigastric pain: Secondary | ICD-10-CM

## 2017-06-27 DIAGNOSIS — R109 Unspecified abdominal pain: Secondary | ICD-10-CM | POA: Diagnosis present

## 2017-06-27 LAB — POCT URINALYSIS DIP (DEVICE)
Bilirubin Urine: NEGATIVE
GLUCOSE, UA: NEGATIVE mg/dL
Hgb urine dipstick: NEGATIVE
Ketones, ur: NEGATIVE mg/dL
NITRITE: NEGATIVE
PH: 5.5 (ref 5.0–8.0)
PROTEIN: NEGATIVE mg/dL
Specific Gravity, Urine: 1.015 (ref 1.005–1.030)
Urobilinogen, UA: 0.2 mg/dL (ref 0.0–1.0)

## 2017-06-27 LAB — POCT I-STAT, CHEM 8
BUN: 8 mg/dL (ref 6–20)
CALCIUM ION: 1.17 mmol/L (ref 1.15–1.40)
Chloride: 101 mmol/L (ref 101–111)
Creatinine, Ser: 0.9 mg/dL (ref 0.44–1.00)
Glucose, Bld: 86 mg/dL (ref 65–99)
HEMATOCRIT: 41 % (ref 36.0–46.0)
HEMOGLOBIN: 13.9 g/dL (ref 12.0–15.0)
Potassium: 3.7 mmol/L (ref 3.5–5.1)
SODIUM: 139 mmol/L (ref 135–145)
TCO2: 27 mmol/L (ref 22–32)

## 2017-06-27 LAB — POCT PREGNANCY, URINE: PREG TEST UR: NEGATIVE

## 2017-06-27 NOTE — Discharge Instructions (Addendum)
Your labs look great.  This could be a musculoskeletal etiology.  Please take 400 mg of ibuprofen every 6-8 hours as needed. If you begin having any chest pain or SOB then please go to the ED. If your pain becomes more severe and/or persistent then please go to the ED.

## 2017-06-27 NOTE — ED Provider Notes (Signed)
06/27/2017 2:34 PM   DOB: 1990/04/21 / MRN: [id number]  SUBJECTIVE:  Sara Keys Doolan is a 27 y.o. female presenting for left-sided abdominal pain that is improving.  States that she had some pain in the area last night with lying on her left side which lasted about 2 seconds.  She denies any alcohol consumption in the last 7 to 8 days.  Cough, fever, shortness of breath.  She denies leg pain leg swelling.  She has No Known Allergies.   She  has a past medical history of Stress fracture of foot (2012).    She  reports that she has never smoked. She has never used smokeless tobacco. She reports that she drinks alcohol. She reports that she does not use drugs. She  reports that she currently engages in sexual activity. She reports using the following method of birth control/protection: None. The patient  has a past surgical history that includes Wisdom tooth extraction.  Her family history includes Cancer in her maternal grandmother; Cancer (age of onset: 58) in her father.  Review of Systems  Constitutional: Negative for fever.  Respiratory: Negative for cough and shortness of breath.   Cardiovascular: Negative for chest pain and leg swelling.  Gastrointestinal: Negative for abdominal pain, blood in stool, constipation, diarrhea, heartburn, melena, nausea and vomiting.  Genitourinary: Negative for dysuria, flank pain, frequency, hematuria and urgency.  Musculoskeletal: Positive for myalgias.  Skin: Negative for rash.  Neurological: Negative for dizziness, tingling, tremors, sensory change, speech change, focal weakness, seizures, loss of consciousness, weakness and headaches.    OBJECTIVE:  BP 121/77 (BP Location: Left Arm)   Pulse 76   Temp 98.7 F (37.1 C) (Oral)   LMP 06/05/2017 (Exact Date)   SpO2 100%   Breastfeeding? No   Physical Exam  Constitutional: She is oriented to person, place, and time. She appears well-nourished. No distress.  Eyes: Pupils are equal, round, and  reactive to light. EOM are normal.  Cardiovascular: Normal rate, regular rhythm, S1 normal, S2 normal, normal heart sounds and intact distal pulses. Exam reveals no gallop, no friction rub and no decreased pulses.  No murmur heard. Pulmonary/Chest: Effort normal. No stridor. No respiratory distress. She has no wheezes. She has no rales.  Abdominal: Soft. Bowel sounds are normal. She exhibits no distension and no mass. There is no hepatosplenomegaly, splenomegaly or hepatomegaly. There is no tenderness. There is no rigidity, no rebound, no guarding, no CVA tenderness, no tenderness at McBurney's point and negative Murphy's sign. No hernia. Hernia confirmed negative in the ventral area, confirmed negative in the right inguinal area and confirmed negative in the left inguinal area.  Musculoskeletal: She exhibits no edema.  Lymphadenopathy:       Head (right side): No submental, no submandibular, no tonsillar, no preauricular, no posterior auricular and no occipital adenopathy present.       Head (left side): No submental, no submandibular, no tonsillar, no preauricular, no posterior auricular and no occipital adenopathy present.    She has no cervical adenopathy.  Neurological: She is alert and oriented to person, place, and time. She displays normal reflexes. No cranial nerve deficit or sensory deficit. She exhibits normal muscle tone. Coordination and gait normal.  Skin: Skin is dry. She is not diaphoretic.  Psychiatric: She has a normal mood and affect.  Vitals reviewed.   Results for orders placed or performed during the hospital encounter of 06/27/17 (from the past 72 hour(s))  POCT urinalysis dip (device)  Status: Abnormal   Collection Time: 06/27/17  2:23 PM  Result Value Ref Range   Glucose, UA NEGATIVE NEGATIVE mg/dL   Bilirubin Urine NEGATIVE NEGATIVE   Ketones, ur NEGATIVE NEGATIVE mg/dL   Specific Gravity, Urine 1.015 1.005 - 1.030   Hgb urine dipstick NEGATIVE NEGATIVE   pH 5.5  5.0 - 8.0   Protein, ur NEGATIVE NEGATIVE mg/dL   Urobilinogen, UA 0.2 0.0 - 1.0 mg/dL   Nitrite NEGATIVE NEGATIVE   Leukocytes, UA TRACE (A) NEGATIVE    Comment: Biochemical Testing Only. Please order routine urinalysis from main lab if confirmatory testing is needed.  I-STAT, chem 8     Status: None   Collection Time: 06/27/17  2:31 PM  Result Value Ref Range   Sodium 139 135 - 145 mmol/L   Potassium 3.7 3.5 - 5.1 mmol/L   Chloride 101 101 - 111 mmol/L   BUN 8 6 - 20 mg/dL   Creatinine, Ser 1.61 0.44 - 1.00 mg/dL   Glucose, Bld 86 65 - 99 mg/dL   Calcium, Ion 0.96 0.45 - 1.40 mmol/L   TCO2 27 22 - 32 mmol/L   Hemoglobin 13.9 12.0 - 15.0 g/dL   HCT 40.9 81.1 - 91.4 %  Pregnancy, urine POC     Status: None   Collection Time: 06/27/17  2:32 PM  Result Value Ref Range   Preg Test, Ur NEGATIVE NEGATIVE    Comment:        THE SENSITIVITY OF THIS METHODOLOGY IS >24 mIU/mL     No results found.  ASSESSMENT AND PLAN:  Orders Placed This Encounter  Procedures  . Urine culture    Standing Status:   Standing    Number of Occurrences:   1  . POCT urinalysis dip (device)    Standing Status:   Standing    Number of Occurrences:   1  . I-STAT, chem 8    Standing Status:   Standing    Number of Occurrences:   1  . Pregnancy, urine POC    Standing Status:   Standing    Number of Occurrences:   1   Epigastric pain - Her exam is normal.  I considered pancreatitis, pneumonia, GERD etiology, gallbladder etiology, renal etiology, constipation.  Her HPI and exam do not match any of these.  I have screened an ISTAT and Urine and these were reassuring.  Given leuks I will send a culture but she is not having those symptoms.       The patient is advised to call or return to clinic if she does not see an improvement in symptoms, or to seek the care of the closest emergency department if she worsens with the above plan.   Deliah Boston, MHS, PA-C 06/27/2017 2:34 PM    Ofilia Neas, PA-C 06/27/17 1439

## 2017-06-27 NOTE — ED Triage Notes (Addendum)
Pain around her left rib cage feels like a stabbing pain, Started Wednesday night and only at night and now it's all day, per pt last night when she inhaled last night she felt a stabbing pain but not today

## 2017-06-28 LAB — URINE CULTURE

## 2017-06-28 NOTE — Progress Notes (Signed)
Urine culture did not suggest a UTI.  Recheck or followup with PCP for further evaluation if symptoms are not improving. Attempted to reach patient. No answer at this time.

## 2017-07-08 ENCOUNTER — Other Ambulatory Visit: Payer: Self-pay | Admitting: Family Medicine

## 2017-07-08 ENCOUNTER — Ambulatory Visit
Admission: RE | Admit: 2017-07-08 | Discharge: 2017-07-08 | Disposition: A | Discharge status: Home / Self Care | Payer: BC Managed Care – PPO | Source: Ambulatory Visit | Attending: Family Medicine | Admitting: Family Medicine

## 2017-07-08 DIAGNOSIS — R079 Chest pain, unspecified: Secondary | ICD-10-CM

## 2017-07-08 DIAGNOSIS — R1032 Left lower quadrant pain: Secondary | ICD-10-CM

## 2017-12-06 LAB — OB RESULTS CONSOLE HEPATITIS B SURFACE ANTIGEN: Hepatitis B Surface Ag: NEGATIVE

## 2017-12-06 LAB — OB RESULTS CONSOLE HIV ANTIBODY (ROUTINE TESTING): HIV: NONREACTIVE

## 2017-12-06 LAB — OB RESULTS CONSOLE GC/CHLAMYDIA
Chlamydia: NEGATIVE
Gonorrhea: NEGATIVE

## 2017-12-06 LAB — OB RESULTS CONSOLE RPR: RPR: NONREACTIVE

## 2017-12-06 LAB — OB RESULTS CONSOLE RUBELLA ANTIBODY, IGM: Rubella: IMMUNE

## 2018-01-29 ENCOUNTER — Inpatient Hospital Stay (HOSPITAL_COMMUNITY)
Admission: AD | Admit: 2018-01-29 | Discharge: 2018-01-29 | Disposition: A | Discharge status: Home / Self Care | Payer: BC Managed Care – PPO | Source: Ambulatory Visit | Attending: Obstetrics and Gynecology | Admitting: Obstetrics and Gynecology

## 2018-01-29 ENCOUNTER — Encounter (HOSPITAL_COMMUNITY): Payer: Self-pay | Admitting: *Deleted

## 2018-01-29 DIAGNOSIS — N949 Unspecified condition associated with female genital organs and menstrual cycle: Secondary | ICD-10-CM | POA: Diagnosis present

## 2018-01-29 DIAGNOSIS — O26892 Other specified pregnancy related conditions, second trimester: Secondary | ICD-10-CM | POA: Diagnosis not present

## 2018-01-29 DIAGNOSIS — R102 Pelvic and perineal pain: Secondary | ICD-10-CM | POA: Insufficient documentation

## 2018-01-29 DIAGNOSIS — O23599 Infection of other part of genital tract in pregnancy, unspecified trimester: Secondary | ICD-10-CM

## 2018-01-29 DIAGNOSIS — Z3A25 25 weeks gestation of pregnancy: Secondary | ICD-10-CM | POA: Diagnosis not present

## 2018-01-29 DIAGNOSIS — B9689 Other specified bacterial agents as the cause of diseases classified elsewhere: Secondary | ICD-10-CM

## 2018-01-29 DIAGNOSIS — O23592 Infection of other part of genital tract in pregnancy, second trimester: Secondary | ICD-10-CM | POA: Diagnosis not present

## 2018-01-29 DIAGNOSIS — O2342 Unspecified infection of urinary tract in pregnancy, second trimester: Secondary | ICD-10-CM

## 2018-01-29 LAB — URINALYSIS, ROUTINE W REFLEX MICROSCOPIC
Bilirubin Urine: NEGATIVE
Glucose, UA: NEGATIVE mg/dL
HGB URINE DIPSTICK: NEGATIVE
Ketones, ur: NEGATIVE mg/dL
Nitrite: NEGATIVE
PROTEIN: NEGATIVE mg/dL
Specific Gravity, Urine: 1.013 (ref 1.005–1.030)
pH: 5 (ref 5.0–8.0)

## 2018-01-29 LAB — WET PREP, GENITAL
Sperm: NONE SEEN
TRICH WET PREP: NONE SEEN
YEAST WET PREP: NONE SEEN

## 2018-01-29 MED ORDER — METRONIDAZOLE 500 MG PO TABS: 500.0000 mg | ORAL_TABLET | Freq: Two times a day (BID) | ORAL | 0 refills | Status: AC

## 2018-01-29 MED ORDER — AMOXICILLIN-POT CLAVULANATE 250-62.5 MG/5ML PO SUSR: 875.0000 mg | Freq: Two times a day (BID) | ORAL | 0 refills | Status: AC

## 2018-01-29 NOTE — MAU Provider Note (Signed)
  History     CSN: [id number]  Arrival date and time: 01/29/18 1019  Kelly Koch is a 27 y.o. G2P1001 at 7049w6d C/O cramping and pelvic pressure since yesterday, unrelieved by rest and hydration, increasing pressure today and pain occasionally radiating to back. Denies LOF or VB. No IC in past 2 weeks. + FM. Denies fever/chills.  Hx term SVD, this pregnancy complicated by asymptomatic bacteriuria for which she received a course of ABX in early pregnancy.     Chief Complaint  Patient presents with  . Pelvic Pain   HPI  OB History    Gravida  2   Para  1   Term  1   Preterm      AB      Living  1     SAB      TAB      Ectopic      Multiple      Live Births  1           Past Medical History:  Diagnosis Date  . Stress fracture of foot 2012   left; healed with use of boot, no cast    Past Surgical History:  Procedure Laterality Date  . WISDOM TOOTH EXTRACTION      Family History  Problem Relation Age of Onset  . Cancer Father 5345       died of cancer; lung?  . Cancer Maternal Grandmother        lung  . Diabetes Neg Hx   . Heart disease Neg Hx   . Stroke Neg Hx   . Hypertension Neg Hx   . Hyperlipidemia Neg Hx     Social History   Tobacco Use  . Smoking status: Never Smoker  . Smokeless tobacco: Never Used  Substance Use Topics  . Alcohol use: Not Currently    Comment: occasional  . Drug use: No    Allergies: No Known Allergies Meds: PNV, probiotic   Review of Systems Physical Exam   Blood pressure 106/71, pulse 96, temperature 98 F (36.7 C), temperature source Oral, resp. rate 17, weight 101.2 kg, last menstrual period 06/05/2017, SpO2 100 %.  FHT 145, mod var, no decels Uterus - mild irritability  Physical Exam   Gen: AAO x 3, NAD Abd: Gravid, NT, S=D, no CVAT GU: SSE normal vaginal discharge, small cervical mucous at os, SVE closed/long/high Wet prep done, + clue cells, no whiff  FfN collected but not sent d/t  closed cvx and low suspicion for PTL.   MAU Course  Procedures Wet prep w/ pelvic exam - + clue cells UA - hazy urine, + bacteria and leuks  MDM Low index of suspicion for PTL  Assessment and Plan  G2P1001 at 2349w6d Pelvic pressure  Suspect recurrent UTI Bacterial vaginosis  Rx Augmentin to cover for ascending UTI (pain radiates to back) and metronidazole coverage for BV Continue daily oral probiotic. DC home with precautions for PTL, push fluids.  To call back if pain increasing or not resolving over next 24-48 hours.  Consult: Dr. Amado NashAlmquist.    Kelly Koch 01/29/2018, 12:11 PM

## 2018-01-29 NOTE — Discharge Instructions (Signed)
Pregnancy and Urinary Tract Infection What is a urinary tract infection? A urinary tract infection (UTI) is an infection of any part of the urinary tract. This includes the kidneys, the tubes that connect your kidneys to your bladder (ureters), the bladder, and the tube that carries urine out of your body (urethra). These organs make, store, and get rid of urine in the body. A UTI can be a bladder infection (cystitis) or a kidney infection (pyelonephritis). This infection may be caused by fungi, viruses, and bacteria. Bacteria are the most common cause of UTIs. You are more likely to develop a UTI during pregnancy because:  The physical and hormonal changes your body goes through can make it easier for bacteria to get into your urinary tract.  Your growing baby puts pressure on your uterus and can affect urine flow.  Does a UTI place my baby at risk? An untreated UTI during pregnancy could lead to a kidney infection, which can cause health problems that could affect your baby. Possible complications of an untreated UTI include:  Having your baby before 37 weeks of pregnancy (premature).  Having a baby with a low birth weight.  Developing high blood pressure during pregnancy (preeclampsia).  What are the symptoms of a UTI? Symptoms of a UTI include:  Fever.  Frequent urination or passing small amounts of urine frequently.  Needing to urinate urgently.  Pain or a burning sensation with urination.  Urine that smells bad or unusual.  Cloudy urine.  Pain in the lower abdomen or back.  Trouble urinating.  Blood in the urine.  Vomiting or being less hungry than normal.  Diarrhea or abdominal pain.  Vaginal discharge.  What are the treatment options for a UTI during pregnancy? Treatment for this condition may include:  Antibiotic medicines that are safe to take during pregnancy.  Other medicines to treat less common causes of UTI.  How can I prevent a UTI?  To prevent a  UTI:  Go to the bathroom as soon as you feel the need.  Always wipe from front to back.  Wash your genital area with soap and warm water daily.  Empty your bladder before and after sex.  Wear cotton underwear.  Limit your intake of high sugar foods or drinks, such as regular soda, juice, and sweets.  Drink 6-8 glasses of water daily.  Do not wear tight-fitting pants.  Do not douche or use deodorant sprays.  Do not drink alcohol, caffeine, or carbonated drinks. These can irritate the bladder.  Contact a health care provider if:  Your symptoms do not improve or get worse.  You have a fever after two days of treatment.  You have a rash.  You have abnormal vaginal discharge.  You have back or side pain.  You have chills.  You have nausea and vomiting. Get help right away if: Seek immediate medical care if you are pregnant and:  You feel contractions in your uterus.  You have lower belly pain.  You have a gush of fluid from your vagina.  You have blood in your urine.  You are vomiting and cannot keep down any medicines or water.  This information is not intended to replace advice given to you by your health care provider. Make sure you discuss any questions you have with your health care provider. Document Released: 06/13/2010 Document Revised: 01/31/2016 Document Reviewed: 01/07/2015 Elsevier Interactive Patient Education  2017 [employer]   Bacterial Vaginosis Bacterial vaginosis is an infection of the vagina.  It happens when too many germs (bacteria) grow in the vagina. This infection puts you at risk for infections from sex (STIs). Treating this infection can lower your risk for some STIs. You should also treat this if you are pregnant. It can cause your baby to be born early. Follow these instructions at home: Medicines  Take over-the-counter and prescription medicines only as told by your doctor.  Take or use your antibiotic medicine as told by your  doctor. Do not stop taking or using it even if you start to feel better. General instructions  If you your sexual partner is a woman, tell her that you have this infection. She needs to get treatment if she has symptoms. If you have a female partner, he does not need to be treated.  During treatment: ? Avoid sex. ? Do not douche. ? Avoid alcohol as told. ? Avoid breastfeeding as told.  Drink enough fluid to keep your pee (urine) clear or pale yellow.  Keep your vagina and butt (rectum) clean. ? Wash the area with warm water every day. ? Wipe from front to back after you use the toilet.  Keep all follow-up visits as told by your doctor. This is important. Preventing this condition  Do not douche.  Use only warm water to wash around your vagina.  Use protection when you have sex. This includes: ? Latex condoms. ? Dental dams.  Limit how many people you have sex with. It is best to only have sex with the same person (be monogamous).  Get tested for STIs. Have your partner get tested.  Wear underwear that is cotton or lined with cotton.  Avoid tight pants and pantyhose. This is most important in summer.  Do not use any products that have nicotine or tobacco in them. These include cigarettes and e-cigarettes. If you need help quitting, ask your doctor.  Do not use illegal drugs.  Limit how much alcohol you drink. Contact a doctor if:  Your symptoms do not get better, even after you are treated.  You have more discharge or pain when you pee (urinate).  You have a fever.  You have pain in your belly (abdomen).  You have pain with sex.  Your bleed from your vagina between periods. Summary  This infection happens when too many germs (bacteria) grow in the vagina.  Treating this condition can lower your risk for some infections from sex (STIs).  You should also treat this if you are pregnant. It can cause early (premature) birth.  Do not stop taking or using your  antibiotic medicine even if you start to feel better. This information is not intended to replace advice given to you by your health care provider. Make sure you discuss any questions you have with your health care provider. Document Released: 11/26/2007 Document Revised: 11/02/2015 Document Reviewed: 11/02/2015 Elsevier Interactive Patient Education  2017 [employer].

## 2018-01-29 NOTE — MAU Note (Signed)
Patient presents with pelvic pain. Cramping and pressure like. Rating pain 4/10. Started yesterday morning and she reports it has gone from an intermittent pain to a constant one. Denies vaginal bleeding. +FM

## 2018-01-30 LAB — CULTURE, OB URINE: Culture: <10000 — AB

## 2018-03-02 NOTE — L&D Delivery Note (Signed)
Delivery Note:   Onset active stage of labor 05/19/2018 at 1330 Complete dilation at 05/19/2018 1500 Onset of pushing at 1500 FHR second stage Category 1 and 2  Analgesia /Anesthesia intrapartum:None  Nitrous gas for transition Pushed in L lateral position and delivered same, fetal head LOA to LOT, meconium stained fluid noted with delivery, terminal meconium noted also.  Baby suctioned after delivery and stimulated to cry, vigorous after 40 seconds and strong cry, to mother's chest for STS Delivery of a Live born female  Birth Weight: 8'5.2"  3775 gm  APGAR: 8, 9  Newborn Delivery   Birth date/time:  05/19/2018 15:26:00 Delivery type:  Vaginal, Spontaneous     Nuchal Cord: loose x 1 Cord double clamped after cessation of pulsation, cut by FOB.  Collection of cord blood donation-None  Arterial cord blood sample-No   Cord blood collected for typing Placenta delivered-Spontaneous  with 3 vessels . Placenta to disposal in L&D. Uterine tone firm, bleeding small  No laceration identified.  Anesthesia:None  Labial Laceration:None  Repair:None  Est. Blood Loss (mL): 300 Complications: none Mom to postpartum.  Baby to Couplet care / Skin to Skin.  Delivery Report:   Review the Delivery Report for details.    Signed: [name], CNM, MSN 05/19/2018, 9:28 PM

## 2018-04-07 LAB — OB RESULTS CONSOLE GBS: GBS: NEGATIVE

## 2018-05-19 ENCOUNTER — Encounter (HOSPITAL_COMMUNITY): Payer: Self-pay | Admitting: *Deleted

## 2018-05-19 ENCOUNTER — Other Ambulatory Visit: Payer: Self-pay

## 2018-05-19 ENCOUNTER — Inpatient Hospital Stay (HOSPITAL_COMMUNITY)
Admission: AD | Admit: 2018-05-19 | Discharge: 2018-05-20 | DRG: 807 | Disposition: A | Discharge status: Home / Self Care | Payer: BC Managed Care – PPO | Attending: Obstetrics and Gynecology | Admitting: Obstetrics and Gynecology

## 2018-05-19 DIAGNOSIS — Z3A41 41 weeks gestation of pregnancy: Secondary | ICD-10-CM

## 2018-05-19 DIAGNOSIS — O480 Post-term pregnancy: Principal | ICD-10-CM | POA: Diagnosis present

## 2018-05-19 DIAGNOSIS — R102 Pelvic and perineal pain: Secondary | ICD-10-CM

## 2018-05-19 DIAGNOSIS — O26892 Other specified pregnancy related conditions, second trimester: Secondary | ICD-10-CM

## 2018-05-19 DIAGNOSIS — O99214 Obesity complicating childbirth: Secondary | ICD-10-CM | POA: Diagnosis present

## 2018-05-19 DIAGNOSIS — N949 Unspecified condition associated with female genital organs and menstrual cycle: Secondary | ICD-10-CM

## 2018-05-19 DIAGNOSIS — E669 Obesity, unspecified: Secondary | ICD-10-CM | POA: Diagnosis present

## 2018-05-19 LAB — CBC
HCT: 38.7 % (ref 36.0–46.0)
Hemoglobin: 13 g/dL (ref 12.0–15.0)
MCH: 30.6 pg (ref 26.0–34.0)
MCHC: 33.6 g/dL (ref 30.0–36.0)
MCV: 91.1 fL (ref 80.0–100.0)
PLATELETS: 263 10*3/uL (ref 150–400)
RBC: 4.25 MIL/uL (ref 3.87–5.11)
RDW: 13 % (ref 11.5–15.5)
WBC: 10.1 10*3/uL (ref 4.0–10.5)
nRBC: 0 % (ref 0.0–0.2)

## 2018-05-19 LAB — TYPE AND SCREEN
ABO/RH(D): B POS
Antibody Screen: NEGATIVE

## 2018-05-19 MED ORDER — ONDANSETRON HCL 4 MG PO TABS: 4.0000 mg | ORAL_TABLET | ORAL | Status: DC | PRN

## 2018-05-19 MED ORDER — DIPHENHYDRAMINE HCL 25 MG PO CAPS: 25.0000 mg | ORAL_CAPSULE | Freq: Four times a day (QID) | ORAL | Status: DC | PRN

## 2018-05-19 MED ORDER — BENZOCAINE-MENTHOL 20-0.5 % EX AERO: 1.0000 "application " | INHALATION_SPRAY | CUTANEOUS | Status: DC | PRN

## 2018-05-19 MED ORDER — FLEET ENEMA 7-19 GM/118ML RE ENEM: 1.0000 | ENEMA | Freq: Every day | RECTAL | Status: DC | PRN

## 2018-05-19 MED ORDER — LACTATED RINGERS IV SOLN: INTRAVENOUS | Status: DC

## 2018-05-19 MED ORDER — DIBUCAINE 1 % RE OINT: 1.0000 "application " | TOPICAL_OINTMENT | RECTAL | Status: DC | PRN

## 2018-05-19 MED ORDER — LIDOCAINE HCL (PF) 1 % IJ SOLN: 30.0000 mL | INTRAMUSCULAR | Status: DC | PRN

## 2018-05-19 MED ORDER — OXYTOCIN 10 UNIT/ML IJ SOLN
10.0000 [IU] | Freq: Once | INTRAMUSCULAR | Status: AC
  Administered 2018-05-19: 10 [IU] via INTRAMUSCULAR
  Filled 2018-05-19: qty 1

## 2018-05-19 MED ORDER — ONDANSETRON HCL 4 MG/2ML IJ SOLN: 4.0000 mg | INTRAMUSCULAR | Status: DC | PRN

## 2018-05-19 MED ORDER — DOCUSATE SODIUM 100 MG PO CAPS
100.0000 mg | ORAL_CAPSULE | Freq: Two times a day (BID) | ORAL | Status: DC
  Administered 2018-05-20: 100 mg via ORAL
  Filled 2018-05-19 (×2): qty 1

## 2018-05-19 MED ORDER — WITCH HAZEL-GLYCERIN EX PADS: 1.0000 "application " | MEDICATED_PAD | CUTANEOUS | Status: DC | PRN

## 2018-05-19 MED ORDER — BISACODYL 10 MG RE SUPP: 10.0000 mg | Freq: Every day | RECTAL | Status: DC | PRN

## 2018-05-19 MED ORDER — ACETAMINOPHEN 325 MG PO TABS: 650.0000 mg | ORAL_TABLET | ORAL | Status: DC | PRN

## 2018-05-19 MED ORDER — PRENATAL MULTIVITAMIN CH
1.0000 | ORAL_TABLET | Freq: Every day | ORAL | Status: DC
  Filled 2018-05-19: qty 1

## 2018-05-19 MED ORDER — ONDANSETRON HCL 4 MG/2ML IJ SOLN: 4.0000 mg | Freq: Four times a day (QID) | INTRAMUSCULAR | Status: DC | PRN

## 2018-05-19 MED ORDER — TETANUS-DIPHTH-ACELL PERTUSSIS 5-2.5-18.5 LF-MCG/0.5 IM SUSP: 0.5000 mL | Freq: Once | INTRAMUSCULAR | Status: DC

## 2018-05-19 MED ORDER — OXYTOCIN 40 UNITS IN NORMAL SALINE INFUSION - SIMPLE MED: 2.5000 [IU]/h | INTRAVENOUS | Status: DC

## 2018-05-19 MED ORDER — SOD CITRATE-CITRIC ACID 500-334 MG/5ML PO SOLN: 30.0000 mL | ORAL | Status: DC | PRN

## 2018-05-19 MED ORDER — COCONUT OIL OIL
1.0000 "application " | TOPICAL_OIL | Status: DC | PRN
  Administered 2018-05-20: 1 via TOPICAL

## 2018-05-19 MED ORDER — LACTATED RINGERS IV SOLN: 500.0000 mL | INTRAVENOUS | Status: DC | PRN

## 2018-05-19 MED ORDER — OXYCODONE-ACETAMINOPHEN 5-325 MG PO TABS: 2.0000 | ORAL_TABLET | ORAL | Status: DC | PRN

## 2018-05-19 MED ORDER — OXYCODONE-ACETAMINOPHEN 5-325 MG PO TABS: 1.0000 | ORAL_TABLET | ORAL | Status: DC | PRN

## 2018-05-19 MED ORDER — OXYTOCIN BOLUS FROM INFUSION: 500.0000 mL | Freq: Once | INTRAVENOUS | Status: DC

## 2018-05-19 MED ORDER — SIMETHICONE 80 MG PO CHEW: 80.0000 mg | CHEWABLE_TABLET | ORAL | Status: DC | PRN

## 2018-05-19 MED ORDER — OXYCODONE HCL 5 MG PO TABS: 5.0000 mg | ORAL_TABLET | ORAL | Status: DC | PRN

## 2018-05-19 MED ORDER — IBUPROFEN 600 MG PO TABS
600.0000 mg | ORAL_TABLET | Freq: Four times a day (QID) | ORAL | Status: DC
  Administered 2018-05-20: 600 mg via ORAL
  Filled 2018-05-19 (×3): qty 1

## 2018-05-19 NOTE — MAU Note (Signed)
Ctx got strong about 1 hour ago about 3-5 min reports mucus plug came out but denies any leaking of fluid

## 2018-05-19 NOTE — Lactation Note (Signed)
This note was copied from a baby's chart. Lactation Consultation Note  Patient Name: Kelly Koch Today's Date: 05/19/2018 Reason for consult: Initial assessment;Term  3 hours old FT female who is being exclusively BF by her mother, she's a P2 and experienced BF. She was able to BF her first baby for 4 months and the only BF difficulty she experienced was infant separation when she had to go back to school. Mom is familiar with hand expression, but hasn't got drops yet; will revise hand expression later with mom, she was nursing baby when [facility] entered the room. Mom has a Medela hand pump at home.  Offered assistance to reposition baby since latch was slightly shallow, baby's arm was on the way when she had him in the cross cradle position; she was also covering baby with a blanket. Explained to mom the importance of STS and she agreed to remove blankets. Baby latched on again with a much deeper latch, wide mouth and lips flanged and started sucking rhythmically but slowed down pretty quickly and started falling asleep. Mom voiced baby had been at the breast already for 15 minutes and she was probably tired. LC praised her for her efforts, documented feedings on Flowsheets. Reviewed normal newborn behavior, feeding cues and cluster feeding.  Feeding plan  1. Encouraged mom to feed baby STS 8-12 times/24 hours or sooner if feeding cues are present 2. Hand expression and spoon/finger feeding was also encouraged  BF brochure and feeding diary were reviewed. Mom reported all questions and concerns were answered, she's aware of LC services and will call PRN.  Maternal Data Formula Feeding for Exclusion: No Has patient been taught Hand Expression?: Yes Does the patient have breastfeeding experience prior to this delivery?: Yes  Feeding Feeding Type: Breast Fed  LATCH Score Latch: Grasps breast easily, tongue down, lips flanged, rhythmical sucking.  Audible Swallowing: None(baby was  falling asleep after last feeding, mostly sucking for comfort)  Type of Nipple: Everted at rest and after stimulation  Comfort (Breast/Nipple): Soft / non-tender  Hold (Positioning): Assistance needed to correctly position infant at breast and maintain latch.  LATCH Score: 7  Interventions Interventions: Breast feeding basics reviewed;Assisted with latch;Skin to skin;Breast compression;Adjust position;Support pillows  Lactation Tools Discussed/Used WIC Program: No   Consult Status Consult Status: Follow-up Date: 05/20/18 Follow-up type: In-patient    Ilana Prezioso Venetia Constable 05/19/2018, 6:35 PM

## 2018-05-19 NOTE — Lactation Note (Signed)
This note was copied from a baby's chart. Lactation Consultation Note  Patient Name: Kelly Koch Today's Date: 05/19/2018 Reason for consult: Follow-up assessment;Term  Kelly Koch is not 6 hours old, mom asked again for [name] assistance, RN [name] asked LC to go back to the room. Mom already nursing baby when entering the room, but she wasn't doing true STS, blanket was in between mom's belly and baby's torso/anterior side of her body. Relatched baby with an open wide mouth and flanged lips in same cross cradle position and mom voiced she can tell the difference, the latch was deeper.  Baby keep actively sucking in a rhythmical pattern but still no audible swallows noted. Baby self-released from the breast for a moment and then [facility] assisted again with repositioning, and this second time he stayed on until [name] left the room, baby still nursing. Reviewed the importance of STS feedings for a deeper latch.  Feeding plan  1. Encouraged mom to feed baby STS 8-12 times/24 hours or sooner if feeding cues are present 2. Hand expression and spoon/finger feeding was also encouraged  Parents reported all questions and concerns were answered, they're both aware of LC services and will call PRN.  Maternal Data    Feeding Feeding Type: Breast Fed  LATCH Score Latch: Grasps breast easily, tongue down, lips flanged, rhythmical sucking.  Audible Swallowing: None  Type of Nipple: Everted at rest and after stimulation  Comfort (Breast/Nipple): Soft / non-tender  Hold (Positioning): Assistance needed to correctly position infant at breast and maintain latch.(still NOT doing STS with baby)  LATCH Score: 7  Interventions Interventions: Breast feeding basics reviewed;Assisted with latch;Skin to skin;Breast massage;Breast compression;Adjust position;Support pillows  Lactation Tools Discussed/Used Tools: Coconut oil   Consult Status Consult Status: Follow-up Date: 05/20/18 Follow-up type:  In-patient    Kelly Koch Kelly Koch 05/19/2018, 9:42 PM

## 2018-05-19 NOTE — H&P (Signed)
OB ADMISSION/ HISTORY & PHYSICAL:  Admission Date: 05/19/2018  1:56 PM  Admit Diagnosis: ctx 1 min    Kelly Koch is a 28 y.o. female presenting for painful contractions since 1330 today. Had some irregular contractions overnight which spaced out by morning and was able to have rest. Labor resumed early afternoon and rapidly progressed to frequent and very painful. Initially planned birth at [facility], but became anxious about rapid progress and presented to MAU while waiting for CNM on call to call back.   Prenatal History: G2P1001   EDC : 05/08/2018  Prenatal care at [facility] since first trimester  Prenatal course complicated by: Obesity, UTI treated with negative TOC, yeast vaginosis  Prenatal Labs: ABO, Rh:   B pos Antibody: NEG (03/19 1454) Rubella: Immune (10/07 0000)  RPR: Nonreactive (10/07 0000)  HBsAg: Negative (10/07 0000)  HIV: Non-reactive (10/07 0000)  GBS: Negative (02/06 0000)  1 hr Glucola : 100 Genetic Screening: declined Ultrasound: normal anatomy, XX  Medical / Surgical History :  Past medical history:  Past Medical History:  Diagnosis Date  . Stress fracture of foot 2012   left; healed with use of boot, no cast     Past surgical history:  Past Surgical History:  Procedure Laterality Date  . WISDOM TOOTH EXTRACTION       Family History:  Family History  Problem Relation Age of Onset  . Cancer Father 17       died of cancer; lung?  . Cancer Maternal Grandmother        lung  . Diabetes Neg Hx   . Heart disease Neg Hx   . Stroke Neg Hx   . Hypertension Neg Hx   . Hyperlipidemia Neg Hx      Social History:  reports that she has never smoked. She has never used smokeless tobacco. She reports previous alcohol use. She reports that she does not use drugs.   Allergies: Patient has no known allergies.   Current Medications at time of admission:  Medications Prior to Admission  Medication Sig Dispense Refill Last Dose  .  Prenatal Vit-Fe Fumarate-FA (PRENATAL MULTIVITAMIN) TABS Take 1 tablet by mouth daily at 12 noon.   More than a month at Unknown time     Review of Systems: ROS As noted above Physical Exam: Vital signs and nursing notes reviewed.  ED Triage Vitals  Enc Vitals Group     BP 05/19/18 1410 132/84     Pulse Rate 05/19/18 1410 (!) 107     Resp 05/19/18 1410 20     Temp 05/19/18 1412 98.5 F (36.9 C)     Temp src --      SpO2 --      Weight --      Height --      Head Circumference --      Peak Flow --      Pain Score --      Pain Loc --      Pain Edu? --      Excl. in GC? --      General: AAO x 3, NAD, coping adequate, spouse at [facility] for labor coach Heart: RRR Lungs:CTAB Abdomen: Gravid, NT, Leopold's vertex, EFW 7.5-8 lbs Extremities: trace edema Genitalia / VE: 8/90/-2, OP BOW intact   FHR: 135 BPM, mod variability, + accels, small variable decels TOCO: Ctx q2 min, strong  Labs:   Pending T&S, CBC, RPR  Recent Labs    05/19/18  1454  WBC 10.1  HGB 13.0  HCT 38.7  PLT 263       Assessment:  28 y.o. G2P1001 at [redacted]w[redacted]d  1. Active stage of labor, transition 2. FHR category 1 / 2 3. GBS neg 4. Desires unmedicated birth 5. Breastfeeding planned 6. Placenta disposal in L&D  Plan:  1. Admit to BS 2. Routine L&D orders 3. Analgesia/anesthesia PRN  - nitrous gas  4. Expectant management 5. Anticipate NSVB  Dr Juliene Pina notified of admission / plan of care   [name] CNM, MSN 05/19/2018, 9:18 PM

## 2018-05-20 LAB — CBC
HCT: 31.7 % — ABNORMAL LOW (ref 36.0–46.0)
Hemoglobin: 11.1 g/dL — ABNORMAL LOW (ref 12.0–15.0)
MCH: 32 pg (ref 26.0–34.0)
MCHC: 35 g/dL (ref 30.0–36.0)
MCV: 91.4 fL (ref 80.0–100.0)
NRBC: 0 % (ref 0.0–0.2)
Platelets: 247 10*3/uL (ref 150–400)
RBC: 3.47 MIL/uL — ABNORMAL LOW (ref 3.87–5.11)
RDW: 13.1 % (ref 11.5–15.5)
WBC: 15.2 10*3/uL — ABNORMAL HIGH (ref 4.0–10.5)

## 2018-05-20 LAB — ABO/RH: ABO/RH(D): B POS

## 2018-05-20 MED ORDER — COCONUT OIL OIL: 1.0000 "application " | TOPICAL_OIL | 0 refills | Status: AC | PRN

## 2018-05-20 MED ORDER — ACETAMINOPHEN 325 MG PO TABS: 650.0000 mg | ORAL_TABLET | ORAL | Status: AC | PRN

## 2018-05-20 MED ORDER — IBUPROFEN 600 MG PO TABS: 600.0000 mg | ORAL_TABLET | Freq: Four times a day (QID) | ORAL | 0 refills | Status: DC

## 2018-05-20 MED ORDER — BENZOCAINE-MENTHOL 20-0.5 % EX AERO: 1.0000 "application " | INHALATION_SPRAY | CUTANEOUS | Status: AC | PRN

## 2018-05-20 NOTE — Discharge Summary (Signed)
OB Discharge Summary  Patient Name: Kelly Koch DOB: [birth year] MRN: [id number]  Date of admission: 05/19/2018 Delivering provider: [name]   Date of discharge: 05/20/2018  Admitting diagnosis: ctx 1 min Intrauterine pregnancy: [redacted]w[redacted]d     Secondary diagnosis:Principal Problem:   Postpartum care following vaginal delivery 3/19 Active Problems:   Normal labor   SVD (spontaneous vaginal delivery)  Additional problems:none     Discharge diagnosis:  Patient Active Problem List   Diagnosis Date Noted  . Normal labor 05/19/2018  . SVD (spontaneous vaginal delivery) 05/19/2018  . Postpartum care following vaginal delivery 3/19 05/19/2018  . Pelvic pressure in pregnancy, antepartum, second trimester 01/29/2018                                                                Post partum procedures:none  Augmentation: none Pain control: None  Laceration:None  Episiotomy:None  Complications: None  APGAR:  1 min Heart Rate:2                          Skin Color:1                           Respiratory Effort:2   Reflex Irritability:1   Muscle Tone:2   Total:8  ,  5 min Heart Rate:2   Skin Color:1   Respiratory Effort:2   Reflex Irritability:2   Muscle Tone:2   Total:9     10 min Heart Rate:   Skin Color:1   Respiratory Effort:   Reflex Irritability:   Muscle Tone:   Total:     Hospital course:  Onset of Labor With Vaginal Delivery     29 y.o. yo G2P1001 at [redacted]w[redacted]d was admitted in Active Labor on 05/19/2018. Patient had an uncomplicated labor course as follows:  Membrane Rupture Time/Date: 3:00 PM ,05/19/2018   Intrapartum Procedures: Episiotomy: None [1]                                         Lacerations:  None [1]  Patient had a delivery of a Viable infant. 05/19/2018  Information for the patient's newborn:  Searls, Girl [place] [[id number]]       Pateint had an uncomplicated postpartum course.  She is ambulating, tolerating a regular diet,  passing flatus, and urinating well. Patient is discharged home in stable condition on 05/20/18.   Physical exam  Vitals:   05/19/18 1755 05/19/18 2107 05/20/18 0138 05/20/18 0553  BP: 107/63 117/85 129/67 110/62  Pulse: 95 97 97 85  Resp: 18 18 18 16   Temp: 99.2 F (37.3 C) 99 F (37.2 C) 98.7 F (37.1 C) 98.7 F (37.1 C)  TempSrc: Oral Oral  Oral  SpO2: 100% 100% 99% 100%   General: alert, cooperative and no distress Lochia: appropriate Uterine Fundus: firm Incision: N/A DVT Evaluation: No cords or calf tenderness. No significant calf/ankle edema. Labs: Lab Results  Component Value Date   WBC 15.2 (H) 05/20/2018   HGB 11.1 (L) 05/20/2018   HCT 31.7 (L) 05/20/2018   MCV 91.4 05/20/2018   PLT 247 05/20/2018   CMP  Latest Ref Rng & Units 06/27/2017  Glucose 65 - 99 mg/dL 86  BUN 6 - 20 mg/dL 8  Creatinine 7.61 - 9.50 mg/dL 9.32  Sodium 671 - 245 mmol/L 139  Potassium 3.5 - 5.1 mmol/L 3.7  Chloride 101 - 111 mmol/L 101  CO2 19 - 32 mEq/L -  Calcium 8.4 - 10.5 mg/dL -  Total Protein 6.0 - 8.3 g/dL -  Total Bilirubin 0.3 - 1.2 mg/dL -  Alkaline Phos 39 - 809 U/L -  AST 0 - 37 U/L -  ALT 0 - 35 U/L -   Discharge instruction: per After Visit Summary and "Baby and Me Booklet".  After Visit Meds:  Allergies as of 05/20/2018   No Known Allergies     Medication List    TAKE these medications   acetaminophen 325 MG tablet Commonly known as:  Tylenol Take 2 tablets (650 mg total) by mouth every 4 (four) hours as needed (for pain scale < 4).   benzocaine-Menthol 20-0.5 % Aero Commonly known as:  DERMOPLAST Apply 1 application topically as needed for irritation (perineal discomfort).   coconut oil Oil Apply 1 application topically as needed.   ibuprofen 600 MG tablet Commonly known as:  ADVIL,MOTRIN Take 1 tablet (600 mg total) by mouth every 6 (six) hours.   prenatal multivitamin Tabs tablet Take 1 tablet by mouth daily at 12 noon.            Discharge  Care Instructions  (From admission, onward)         Start     Ordered   05/20/18 0000  Discharge wound care:    Comments:  Sitz baths 2 times /day with warm water x 1 week   05/20/18 1321          Diet: routine diet  Activity: Advance as tolerated. Pelvic rest for 6 weeks.   Postpartum contraception: Not Discussed  Newborn Data: Live born female  Birth Weight: 8 lb 5.2 oz (3775 g) APGAR: 8, 9  Newborn Delivery   Birth date/time:  05/19/2018 15:26:00 Delivery type:  Vaginal, Spontaneous     named Braylee Baby Feeding: Breast Disposition:home with mother   Delivery Report:  Review the Delivery Report for details.    Follow up: Follow-up Information    [name], CNM. Schedule an appointment as soon as possible for a visit in 2 week(s).   Specialty:  Obstetrics and Gynecology Contact information: [address] [place] [address] [phone]             Signed: Cipriano Mile, MSN 05/20/2018, 1:23 PM

## 2018-05-20 NOTE — Progress Notes (Signed)
Patient ID: Kelly Koch, female   DOB: August 02, 1990, 28 y.o.   MRN: [id number] PPD # 1 S/P NSVD  Live born female  Birth Weight: 8 lb 5.2 oz (3775 g) APGAR: 8, 9  Newborn Delivery   Birth date/time:  05/19/2018 15:26:00 Delivery type:  Vaginal, Spontaneous    Baby name: Braylee Delivering provider: [name]  Episiotomy:None   Lacerations:None   Feeding: breast  Pain control at delivery: None   S:  Reports feeling well. Desires early DC             Tolerating po/ No nausea or vomiting             Bleeding is decreased             Pain controlled with ibuprofen (OTC)             Up ad lib / ambulatory / voiding without difficulties   O:  A & O x 3, in no apparent distress              VS:  Vitals:   05/19/18 1755 05/19/18 2107 05/20/18 0138 05/20/18 0553  BP: 107/63 117/85 129/67 110/62  Pulse: 95 97 97 85  Resp: 18 18 18 16   Temp: 99.2 F (37.3 C) 99 F (37.2 C) 98.7 F (37.1 C) 98.7 F (37.1 C)  TempSrc: Oral Oral  Oral  SpO2: 100% 100% 99% 100%    LABS:  Recent Labs    05/19/18 1454 05/20/18 0535  WBC 10.1 15.2*  HGB 13.0 11.1*  HCT 38.7 31.7*  PLT 263 247    Blood type: --/--/B POS, B POS (03/19 1454)  Rubella: Immune (10/07 0000)   I&O: No intake/output data recorded.          No intake/output data recorded.   Lungs: Clear and unlabored  Heart: regular rate and rhythm / no murmurs  Abdomen: soft, non-tender, non-distended             Fundus: firm, non-tender, U-2  Perineum: intact, no edema  Lochia: small  Extremities: no edema, no calf pain or tenderness    A/P: PPD # 1 27 y.o., G2P1001   Principal Problem:   Postpartum care following vaginal delivery 3/19 Active Problems:   Normal labor   SVD (spontaneous vaginal delivery)   Doing well - stable status  Routine post partum orders  Breastfeeding well, experienced  Abdominal binder for comfort while ambulating  DC home today w/ instructions  F/U at [facility] center in 2  weeks and PRN     [name], MSN, CNM 05/20/2018, 12:40 PM

## 2018-05-20 NOTE — Lactation Note (Signed)
This note was copied from a baby's chart. Lactation Consultation Note  Patient Name: Girl [place] Rinks Today's Date: 05/20/2018 Reason for consult: Follow-up assessment   Baby 20 hours old and mother requested assistance w/ latching. Noted short lingual frenulum.  Baby coming off and on breast. Suggest discussing with Pediatrician. Assisted w/ cross cradle latch.  Mother kept compressing breast to pull tissue away from infant's nose. Provided education. Encouraged mother to keep working on latch. Feed on demand approximately 8-12 times per day.   Suggest if mother continues to have difficulty, call for assistance and ask for DEBP.    Maternal Data    Feeding Feeding Type: Breast Fed  LATCH Score Latch: Grasps breast easily, tongue down, lips flanged, rhythmical sucking.  Audible Swallowing: A few with stimulation  Type of Nipple: Everted at rest and after stimulation  Comfort (Breast/Nipple): Soft / non-tender  Hold (Positioning): Assistance needed to correctly position infant at breast and maintain latch.  LATCH Score: 8  Interventions Interventions: Breast feeding basics reviewed;Assisted with latch;Hand express;Breast compression  Lactation Tools Discussed/Used     Consult Status Consult Status: Follow-up Date: 05/21/18 Follow-up type: In-patient    Dahlia Byes [facility] 05/20/2018, 12:19 PM

## 2018-05-21 LAB — RPR: RPR Ser Ql: NONREACTIVE

## 2018-07-08 ENCOUNTER — Other Ambulatory Visit: Payer: Self-pay | Admitting: Physician Assistant

## 2018-07-08 ENCOUNTER — Other Ambulatory Visit: Payer: Self-pay | Admitting: Internal Medicine

## 2018-07-08 DIAGNOSIS — N6310 Unspecified lump in the right breast, unspecified quadrant: Secondary | ICD-10-CM

## 2018-07-31 ENCOUNTER — Encounter (HOSPITAL_COMMUNITY): Payer: Self-pay

## 2018-08-01 ENCOUNTER — Other Ambulatory Visit: Payer: Self-pay | Admitting: Physician Assistant

## 2018-08-01 ENCOUNTER — Ambulatory Visit
Admission: RE | Admit: 2018-08-01 | Discharge: 2018-08-01 | Disposition: A | Discharge status: Home / Self Care | Payer: BC Managed Care – PPO | Source: Ambulatory Visit | Attending: Physician Assistant | Admitting: Physician Assistant

## 2018-08-01 ENCOUNTER — Other Ambulatory Visit: Payer: Self-pay

## 2018-08-01 DIAGNOSIS — N6310 Unspecified lump in the right breast, unspecified quadrant: Secondary | ICD-10-CM

## 2019-02-02 ENCOUNTER — Other Ambulatory Visit: Payer: Self-pay

## 2019-02-02 ENCOUNTER — Other Ambulatory Visit: Payer: Self-pay | Admitting: Physician Assistant

## 2019-02-02 ENCOUNTER — Ambulatory Visit
Admission: RE | Admit: 2019-02-02 | Discharge: 2019-02-02 | Disposition: A | Discharge status: Home / Self Care | Payer: BC Managed Care – PPO | Source: Ambulatory Visit | Attending: Physician Assistant | Admitting: Physician Assistant

## 2019-02-02 DIAGNOSIS — N6310 Unspecified lump in the right breast, unspecified quadrant: Secondary | ICD-10-CM

## 2019-08-03 ENCOUNTER — Other Ambulatory Visit: Payer: Self-pay

## 2019-08-03 ENCOUNTER — Ambulatory Visit
Admission: RE | Admit: 2019-08-03 | Discharge: 2019-08-03 | Disposition: A | Discharge status: Home / Self Care | Payer: 59 | Source: Ambulatory Visit | Attending: Physician Assistant | Admitting: Physician Assistant

## 2019-08-03 DIAGNOSIS — N6310 Unspecified lump in the right breast, unspecified quadrant: Secondary | ICD-10-CM

## 2019-08-19 ENCOUNTER — Other Ambulatory Visit: Payer: Self-pay

## 2019-08-19 DIAGNOSIS — Z20822 Contact with and (suspected) exposure to covid-19: Secondary | ICD-10-CM

## 2019-08-20 LAB — SARS-COV-2, NAA 2 DAY TAT

## 2019-08-20 LAB — NOVEL CORONAVIRUS, NAA: SARS-CoV-2, NAA: NOT DETECTED

## 2020-04-22 IMAGING — US US BREAST*R* LIMITED INC AXILLA
1 series · 6 of 6 positions shown · non-contrast
Comparison: 08/01/2018

CLINICAL DATA: Six-month follow-up of probably benign RIGHT breast
mass. Patient stopped breast feeding approximately 3 months ago. She
is 8 months postpartum.

EXAM:
ULTRASOUND OF THE RIGHT BREAST

[Series 1: us breast*right* limited inc axilla · 0.04mm/px · 6 of 6 slices shown]
[im 1/6]
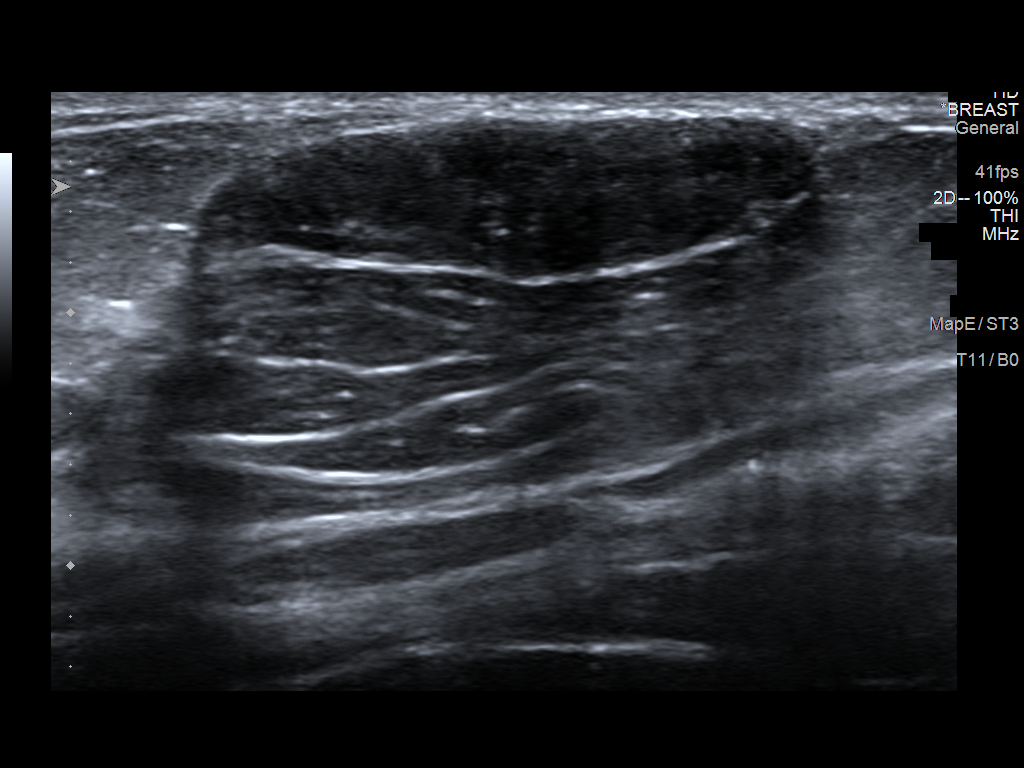
[im 2/6]
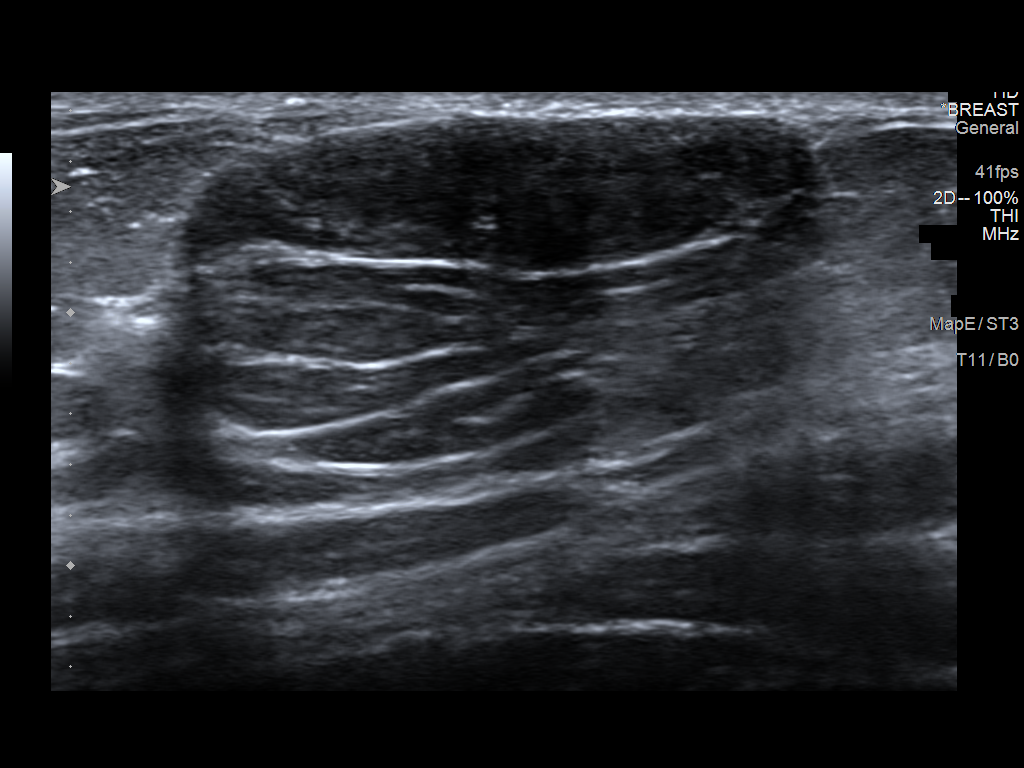
[im 3/6]
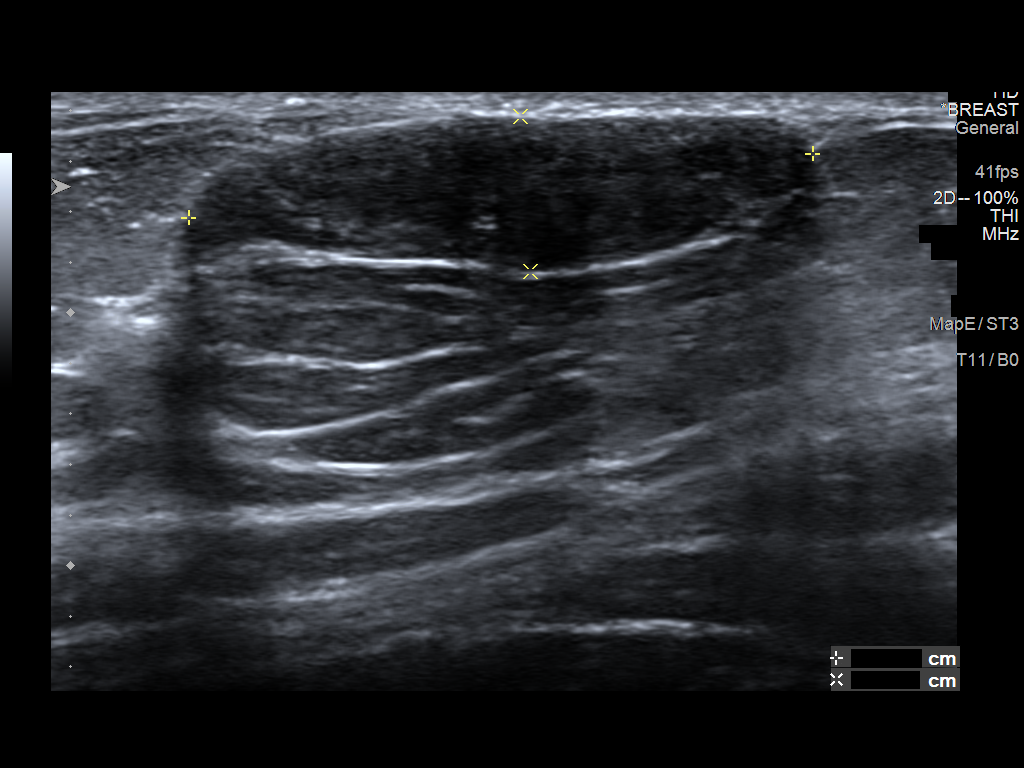
[im 4/6]
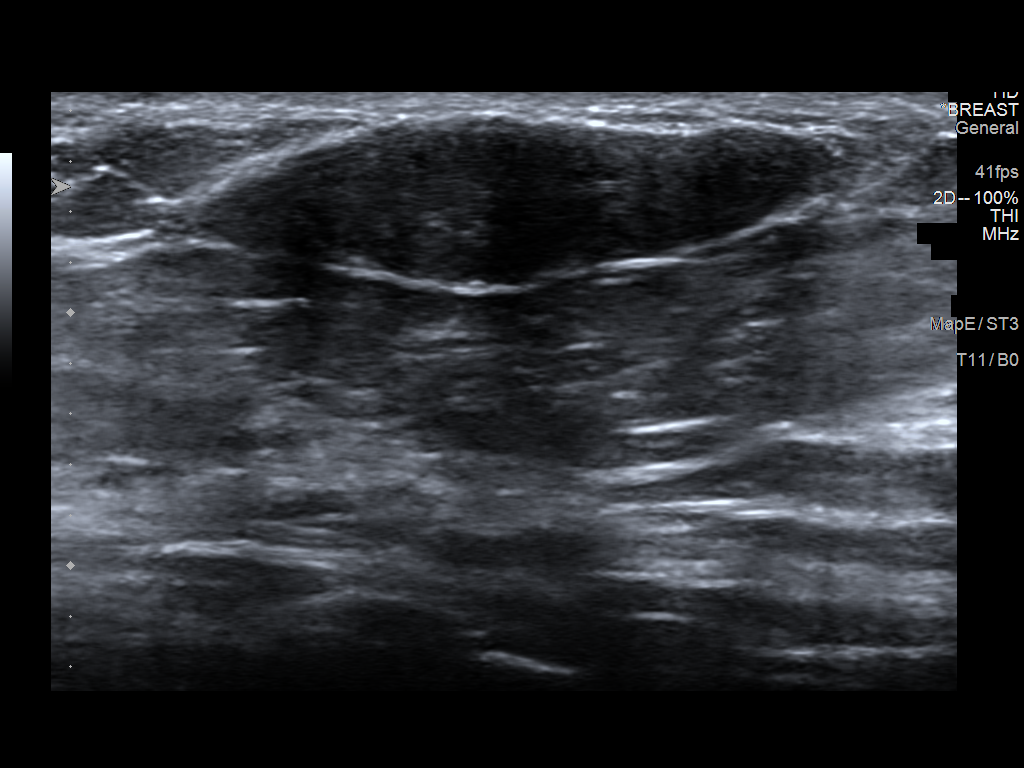
[im 5/6]
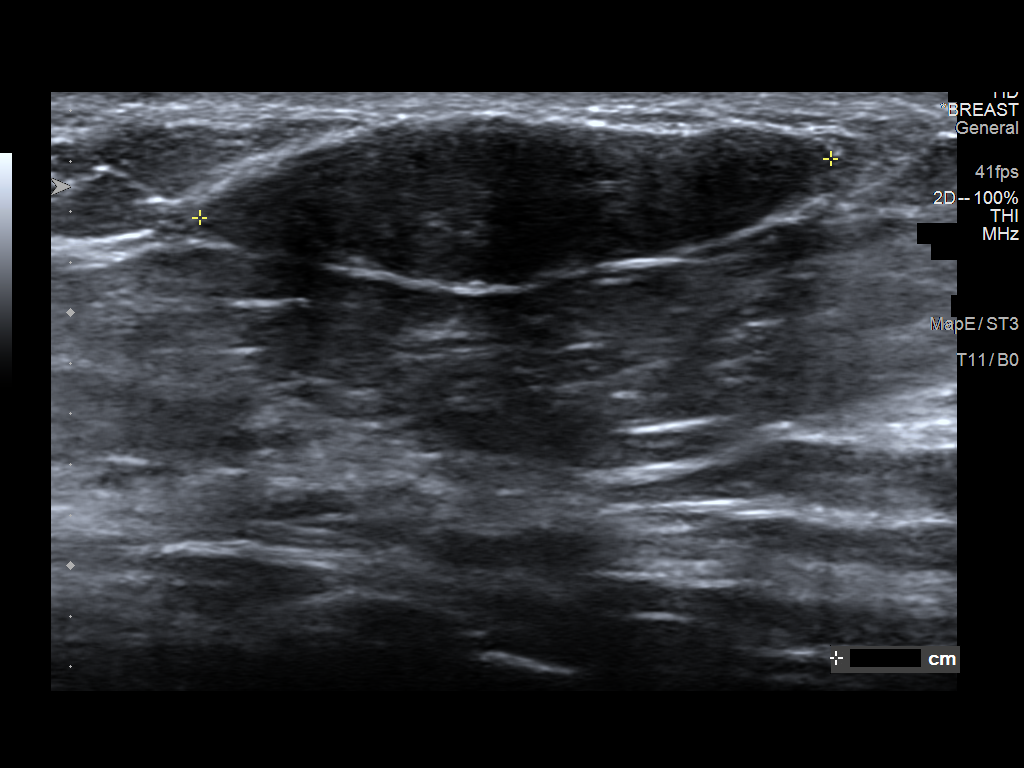
[im 6/6]
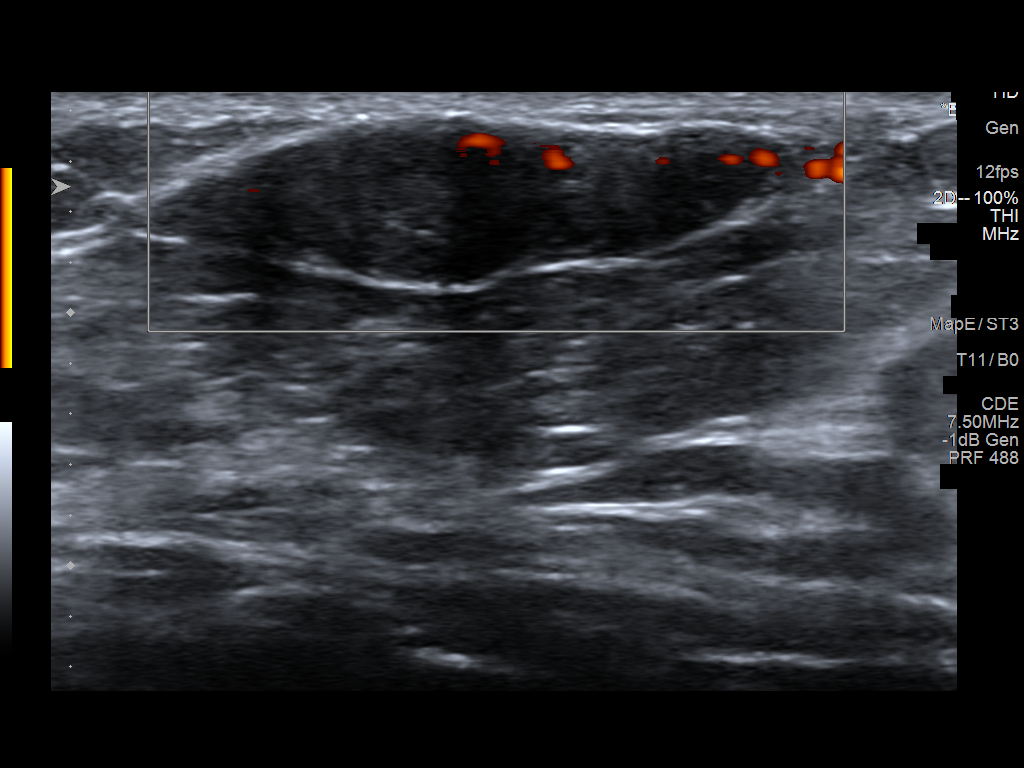

[6 of 6 positions shown; findings below may reference images not displayed]

FINDINGS: On physical exam, I palpate no abnormality in the 8 o'clock location
of the RIGHT breast.

Targeted ultrasound is performed, showing a circumscribed oval
hypoechoic parallel mass in the 8 o'clock location of the RIGHT
breast 12 centimeters from the nipple. Today mass measures 2.5 x
x 2.5 centimeters. Previously mass measured 2.9 x 1.0 x
centimeters. There are focal areas of internal blood flow on Doppler
evaluation.
IMPRESSION: Smaller appearance of probably benign mass in the RIGHT breast, more
likely representing fibroadenoma than lactating adenoma given its
persistence.

RECOMMENDATION:
Recommend RIGHT breast ultrasound in 6 months.

I have discussed the findings and recommendations with the patient.
If applicable, a reminder letter will be sent to the patient
regarding the next appointment.

BI-RADS CATEGORY  3: Probably benign.

## 2021-08-14 ENCOUNTER — Emergency Department (HOSPITAL_BASED_OUTPATIENT_CLINIC_OR_DEPARTMENT_OTHER)
Admission: EM | Admit: 2021-08-14 | Discharge: 2021-08-14 | Discharge status: Against Medical Advice | Payer: 59 | Attending: Emergency Medicine | Admitting: Emergency Medicine

## 2021-08-14 ENCOUNTER — Encounter (HOSPITAL_BASED_OUTPATIENT_CLINIC_OR_DEPARTMENT_OTHER): Payer: Self-pay | Admitting: Emergency Medicine

## 2021-08-14 ENCOUNTER — Other Ambulatory Visit: Payer: Self-pay

## 2021-08-14 ENCOUNTER — Emergency Department (HOSPITAL_BASED_OUTPATIENT_CLINIC_OR_DEPARTMENT_OTHER): Payer: 59 | Admitting: Radiology

## 2021-08-14 DIAGNOSIS — R0789 Other chest pain: Secondary | ICD-10-CM | POA: Insufficient documentation

## 2021-08-14 DIAGNOSIS — R079 Chest pain, unspecified: Secondary | ICD-10-CM

## 2021-08-14 LAB — CBC
HCT: 38.9 % (ref 36.0–46.0)
Hemoglobin: 13.2 g/dL (ref 12.0–15.0)
MCH: 29.7 pg (ref 26.0–34.0)
MCHC: 33.9 g/dL (ref 30.0–36.0)
MCV: 87.6 fL (ref 80.0–100.0)
Platelets: 296 10*3/uL (ref 150–400)
RBC: 4.44 MIL/uL (ref 3.87–5.11)
RDW: 12.1 % (ref 11.5–15.5)
WBC: 7.1 10*3/uL (ref 4.0–10.5)
nRBC: 0 % (ref 0.0–0.2)

## 2021-08-14 LAB — BASIC METABOLIC PANEL
Anion gap: 9 (ref 5–15)
BUN: 10 mg/dL (ref 6–20)
CO2: 26 mmol/L (ref 22–32)
Calcium: 9.3 mg/dL (ref 8.9–10.3)
Chloride: 104 mmol/L (ref 98–111)
Creatinine, Ser: 0.98 mg/dL (ref 0.44–1.00)
GFR, Estimated: >60 mL/min (ref >60)
Glucose, Bld: 108 mg/dL — ABNORMAL HIGH (ref 70–99)
Potassium: 3.9 mmol/L (ref 3.5–5.1)
Sodium: 139 mmol/L (ref 135–145)

## 2021-08-14 LAB — D-DIMER, QUANTITATIVE: D-Dimer, Quant: 0.27 ug/mL-FEU (ref 0.00–0.50)

## 2021-08-14 LAB — TROPONIN I (HIGH SENSITIVITY)
Troponin I (High Sensitivity): <2 ng/L (ref <18)
Troponin I (High Sensitivity): <2 ng/L (ref <18)

## 2021-08-14 LAB — PREGNANCY, URINE: Preg Test, Ur: NEGATIVE

## 2021-08-14 NOTE — ED Triage Notes (Signed)
Patient presents to ED via POV from home. Patient reports left sided chest pain. Denies pain radiation. Denies nausea or shortness of breath.

## 2021-08-14 NOTE — ED Provider Notes (Signed)
[facility] EMERGENCY DEPT Provider Note   CSN: [id number] Arrival date & time: 08/14/21  1321     History  Chief Complaint  Patient presents with   Chest Pain    Kelly Koch is a 31 y.o. female.   Chest Pain Associated symptoms: no abdominal pain, no back pain, no cough, no fever, no palpitations, no shortness of breath and no vomiting    31 year old female presents emergency department with complaints of chest pain.  Patient states that chest pain is been present for the past week it is described as heaviness.  Symptoms began when she was laying down in bed at night not exerting herself.  Pain is exacerbated with palpation over affected area as well as taking a deep breath.  She denies shortness of breath besides when feeling pain.  Denies increase shortness of breath with activity.  She denies history of DVT/PE, cancer but does note she has been taking oral contraceptives but stopped 1 month ago.  Denies calf pain or lower extremity swelling.  Denies personal history of cardiac/pulmonary issues.  Past Medical History:  Diagnosis Date   Stress fracture of foot 2012   left; healed with use of boot, no cast   Past Surgical History:  Procedure Laterality Date   WISDOM TOOTH EXTRACTION      Home Medications Prior to Admission medications   Medication Sig Start Date End Date Taking? Authorizing Provider  acetaminophen (TYLENOL) 325 MG tablet Take 2 tablets (650 mg total) by mouth every 4 (four) hours as needed (for pain scale < 4). 05/20/18   [name], CNM  benzocaine-Menthol (DERMOPLAST) 20-0.5 % AERO Apply 1 application topically as needed for irritation (perineal discomfort). 05/20/18   [name], CNM  coconut oil OIL Apply 1 application topically as needed. 05/20/18   [name], CNM  ibuprofen (ADVIL,MOTRIN) 600 MG tablet Take 1 tablet (600 mg total) by mouth every 6 (six) hours. 05/20/18   [name], CNM  Prenatal Vit-Fe  Fumarate-FA (PRENATAL MULTIVITAMIN) TABS Take 1 tablet by mouth daily at 12 noon.    [provider]      Allergies    Patient has no known allergies.    Review of Systems   Review of Systems  Constitutional:  Negative for chills and fever.  HENT:  Negative for ear pain and sore throat.   Eyes:  Negative for pain and visual disturbance.  Respiratory:  Negative for cough and shortness of breath.   Cardiovascular:  Positive for chest pain. Negative for palpitations.  Gastrointestinal:  Negative for abdominal pain and vomiting.  Genitourinary:  Negative for dysuria and hematuria.  Musculoskeletal:  Negative for arthralgias and back pain.  Skin:  Negative for color change and rash.  Neurological:  Negative for seizures and syncope.  All other systems reviewed and are negative.   Physical Exam Updated Vital Signs BP 123/82 Comment: 96 (MEAN)  Pulse 70   Temp 98.5 F (36.9 C)   Resp 18   Ht 5\' 7"  (1.702 m)   Wt 113.4 kg   LMP 08/08/2021   SpO2 100%   BMI 39.16 kg/m  Physical Exam Vitals and nursing note reviewed.  Constitutional:      General: She is not in acute distress.    Appearance: She is well-developed.  HENT:     Head: Normocephalic and atraumatic.  Eyes:     Extraocular Movements: Extraocular movements intact.     Conjunctiva/sclera: Conjunctivae normal.  Cardiovascular:  Rate and Rhythm: Normal rate and regular rhythm.     Heart sounds: Normal heart sounds. No murmur heard.    Comments: Chest wall tenderness on the left of sternal border.  No overlying skin abnormality noted. Pulmonary:     Effort: Pulmonary effort is normal. No respiratory distress.     Breath sounds: Normal breath sounds.  Abdominal:     Palpations: Abdomen is soft.     Tenderness: There is no abdominal tenderness.  Musculoskeletal:        General: No swelling.     Cervical back: Neck supple.     Right lower leg: No tenderness. No edema.     Left lower leg: No tenderness. No  edema.  Skin:    General: Skin is warm and dry.     Capillary Refill: Capillary refill takes less than 2 seconds.  Neurological:     General: No focal deficit present.     Mental Status: She is alert and oriented to person, place, and time.  Psychiatric:        Mood and Affect: Mood normal.     ED Results / Procedures / Treatments   Labs (all labs ordered are listed, but only abnormal results are displayed) Labs Reviewed  BASIC METABOLIC PANEL - Abnormal; Notable for the following components:      Result Value   Glucose, Bld 108 (*)    All other components within normal limits  CBC  PREGNANCY, URINE  D-DIMER, QUANTITATIVE  TROPONIN I (HIGH SENSITIVITY)  TROPONIN I (HIGH SENSITIVITY)    EKG EKG Interpretation  Date/Time:  Thursday August 14 2021 13:33:03 EDT Ventricular Rate:  96 PR Interval:  150 QRS Duration: 64 QT Interval:  366 QTC Calculation: 462 R Axis:   52 Text Interpretation: Normal sinus rhythm Normal ECG No old tracing to compare Confirmed by Meridee Score [phone]) on 08/14/2021 1:51:54 PM  Radiology DG Chest 2 View  Result Date: 08/14/2021 CLINICAL DATA:  chest pain EXAM: CHEST - 2 VIEW COMPARISON:  None Available. FINDINGS: The heart size and mediastinal contours are within normal limits. Both lungs are clear. The visualized skeletal structures are unremarkable. IMPRESSION: No active cardiopulmonary disease. Electronically Signed   By: Marjo Bicker M.D.   On: 08/14/2021 14:08    Procedures Procedures    Medications Ordered in ED Medications - No data to display  ED Course/ Medical Decision Making/ A&P Clinical Course as of 08/14/21 1917  Thu Aug 14, 2021  1758 DG Chest 2 View [CR]    Clinical Course User Index [CR] Peter Garter, [place]                           Medical Decision Making Amount and/or Complexity of Data Reviewed Labs: ordered. Radiology: ordered.   This patient presents to the ED for concern of chest pain, this involves an  extensive number of treatment options, and is a complaint that carries with it a high risk of complications and morbidity.  The differential diagnosis includes The emergent causes of chest pain include: Acute coronary syndrome, tamponade, pericarditis/myocarditis, aortic dissection, pulmonary embolism, tension pneumothorax, pneumonia, and esophageal rupture. I do not believe the patient has an emergent cause of chest pain, other urgent/non-acute considerations include, but are not limited to: chronic angina, aortic stenosis, cardiomyopathy, mitral valve prolapse, pulmonary hypertension, aortic insufficiency, right ventricular hypertrophy, pleuritis, bronchitis, pneumothorax, tumor, gastroesophageal reflux disease (GERD), esophageal spasm, Mallory-Weiss syndrome, peptic ulcer disease, pancreatitis,  functional gastrointestinal pain, cervical or thoracic disk disease or arthritis, shoulder arthritis, costochondritis, subacromial bursitis, anxiety or panic attack, herpes zoster, breast disorders, chest wall tumors, thoracic outlet syndrome, mediastinitis.   Co morbidities that complicate the patient evaluation  Obesity  Lab Tests:  I Ordered, and personally interpreted labs.  The pertinent results include: No acute abnormality   Imaging Studies ordered:  I ordered imaging studies including chest x-ray I independently visualized and interpreted imaging which showed no acute abnormality I agree with the radiologist interpretation   Cardiac Monitoring: / EKG:  The patient was maintained on a cardiac monitor.  I personally viewed and interpreted the cardiac monitored which showed an underlying rhythm of: Sinus rhythm   Consultations Obtained:  N/a   Problem List / ED Course / Critical interventions / Medication management  Chest pain Reevaluation of the patient showed that the patient improved I have reviewed the patients home medicines and have made adjustments as needed   Social  Determinants of Health:  Denies tobacco, alcohol, illicit drug use.   Test / Admission - Considered:  Chest pain Vitals signs within normal range and stable throughout visit. Laboratory/imaging studies significant for: No acute abnormality Upon waiting for D-dimer to hit, patient eloped.  D-dimer is negative.  Patient was expected to be discharged after result hit.  Symptoms are likely related to musculoskeletal pain given reproducibility upon exam.  Conversation was not able to be had with patient regarding results before she left.        Final Clinical Impression(s) / ED Diagnoses Final diagnoses:  Chest pain, unspecified type    Rx / DC Orders ED Discharge Orders     None         Peter Garter, [place] 08/14/21 2426    Pricilla Loveless, MD 08/14/21 2129

## 2021-08-14 NOTE — ED Notes (Signed)
RT Note: Pt. roomed to #8 with v/s updated, call bell within reach. Charge RN aware.

## 2022-09-23 ENCOUNTER — Encounter (HOSPITAL_COMMUNITY): Payer: Self-pay

## 2022-09-23 ENCOUNTER — Ambulatory Visit (HOSPITAL_COMMUNITY)
Admission: EM | Admit: 2022-09-23 | Discharge: 2022-09-23 | Disposition: A | Discharge status: Home / Self Care | Payer: No Typology Code available for payment source | Attending: Nurse Practitioner | Admitting: Nurse Practitioner

## 2022-09-23 DIAGNOSIS — S161XXA Strain of muscle, fascia and tendon at neck level, initial encounter: Secondary | ICD-10-CM

## 2022-09-23 MED ORDER — KETOROLAC TROMETHAMINE 30 MG/ML IJ SOLN
INTRAMUSCULAR | Status: AC
  Filled 2022-09-23: qty 1

## 2022-09-23 MED ORDER — TIZANIDINE HCL 4 MG PO TABS: 4.0000 mg | ORAL_TABLET | Freq: Three times a day (TID) | ORAL | 0 refills | Status: AC | PRN

## 2022-09-23 MED ORDER — KETOROLAC TROMETHAMINE 30 MG/ML IJ SOLN
30.0000 mg | Freq: Once | INTRAMUSCULAR | Status: AC
  Administered 2022-09-23: 30 mg via INTRAMUSCULAR

## 2022-09-23 MED ORDER — IBUPROFEN 800 MG PO TABS: 800.0000 mg | ORAL_TABLET | Freq: Three times a day (TID) | ORAL | 0 refills | Status: AC | PRN

## 2022-09-23 NOTE — Discharge Instructions (Addendum)
We have given you a shot of Toradol today for pain.  Avoid NSAIDs for the next 48 hours.  Starting today, recommend taking Tylenol 500 to 1000 mg every 6 hours alternating with tizanidine every 8 hours as needed for pain.  After 48 hours, you can alternate Tylenol with ibuprofen 800 mg every 8 hours for pain.  Seek care in the emergency room if you develop numbness or tingling in your upper extremities, headache, or vision changes.
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
## 2022-11-02 IMAGING — DX DG CHEST 2V
2 series · 2 of 2 positions shown · non-contrast
Comparison: None Available.

CLINICAL DATA: chest pain

EXAM:
CHEST - 2 VIEW

[chest pa]
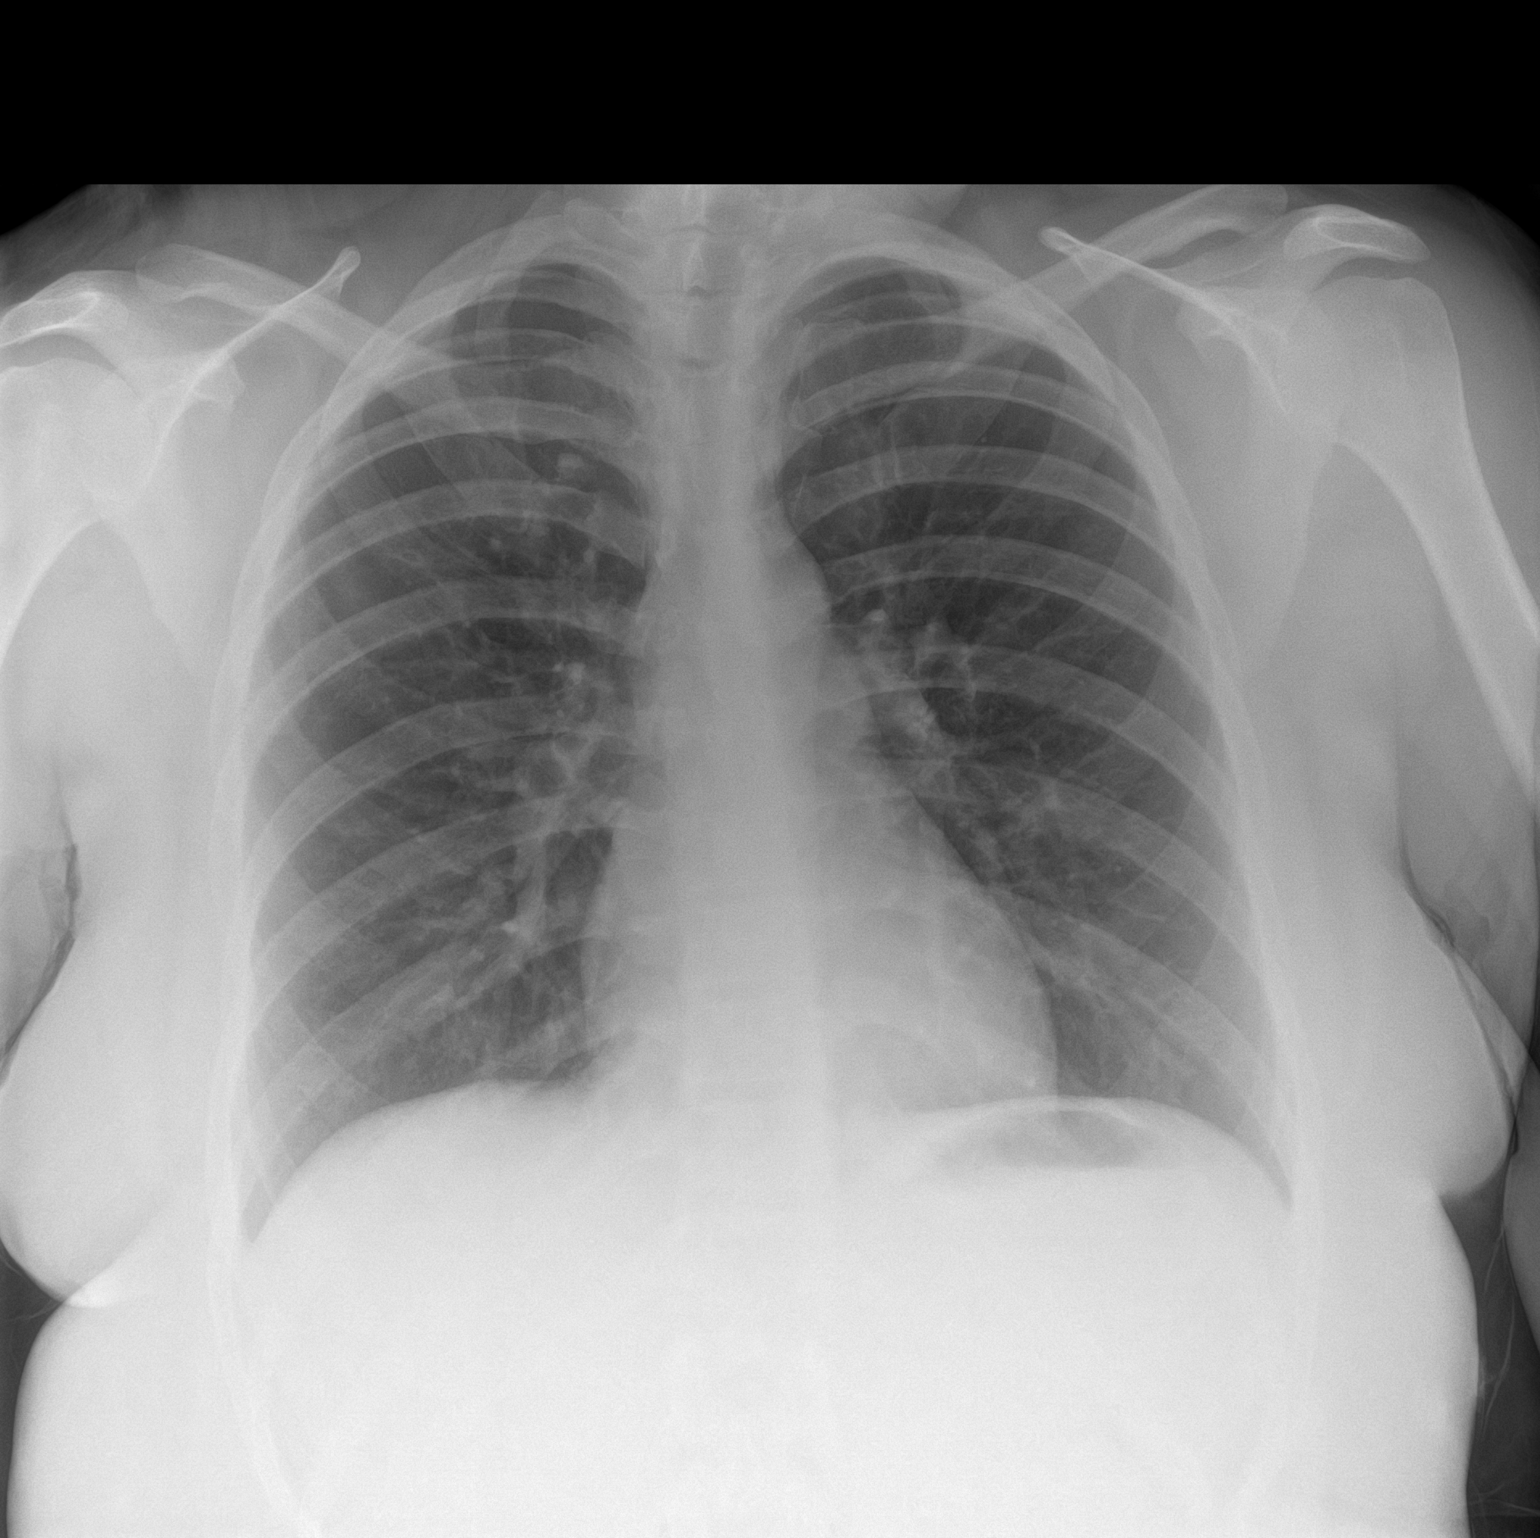

[chest lat]
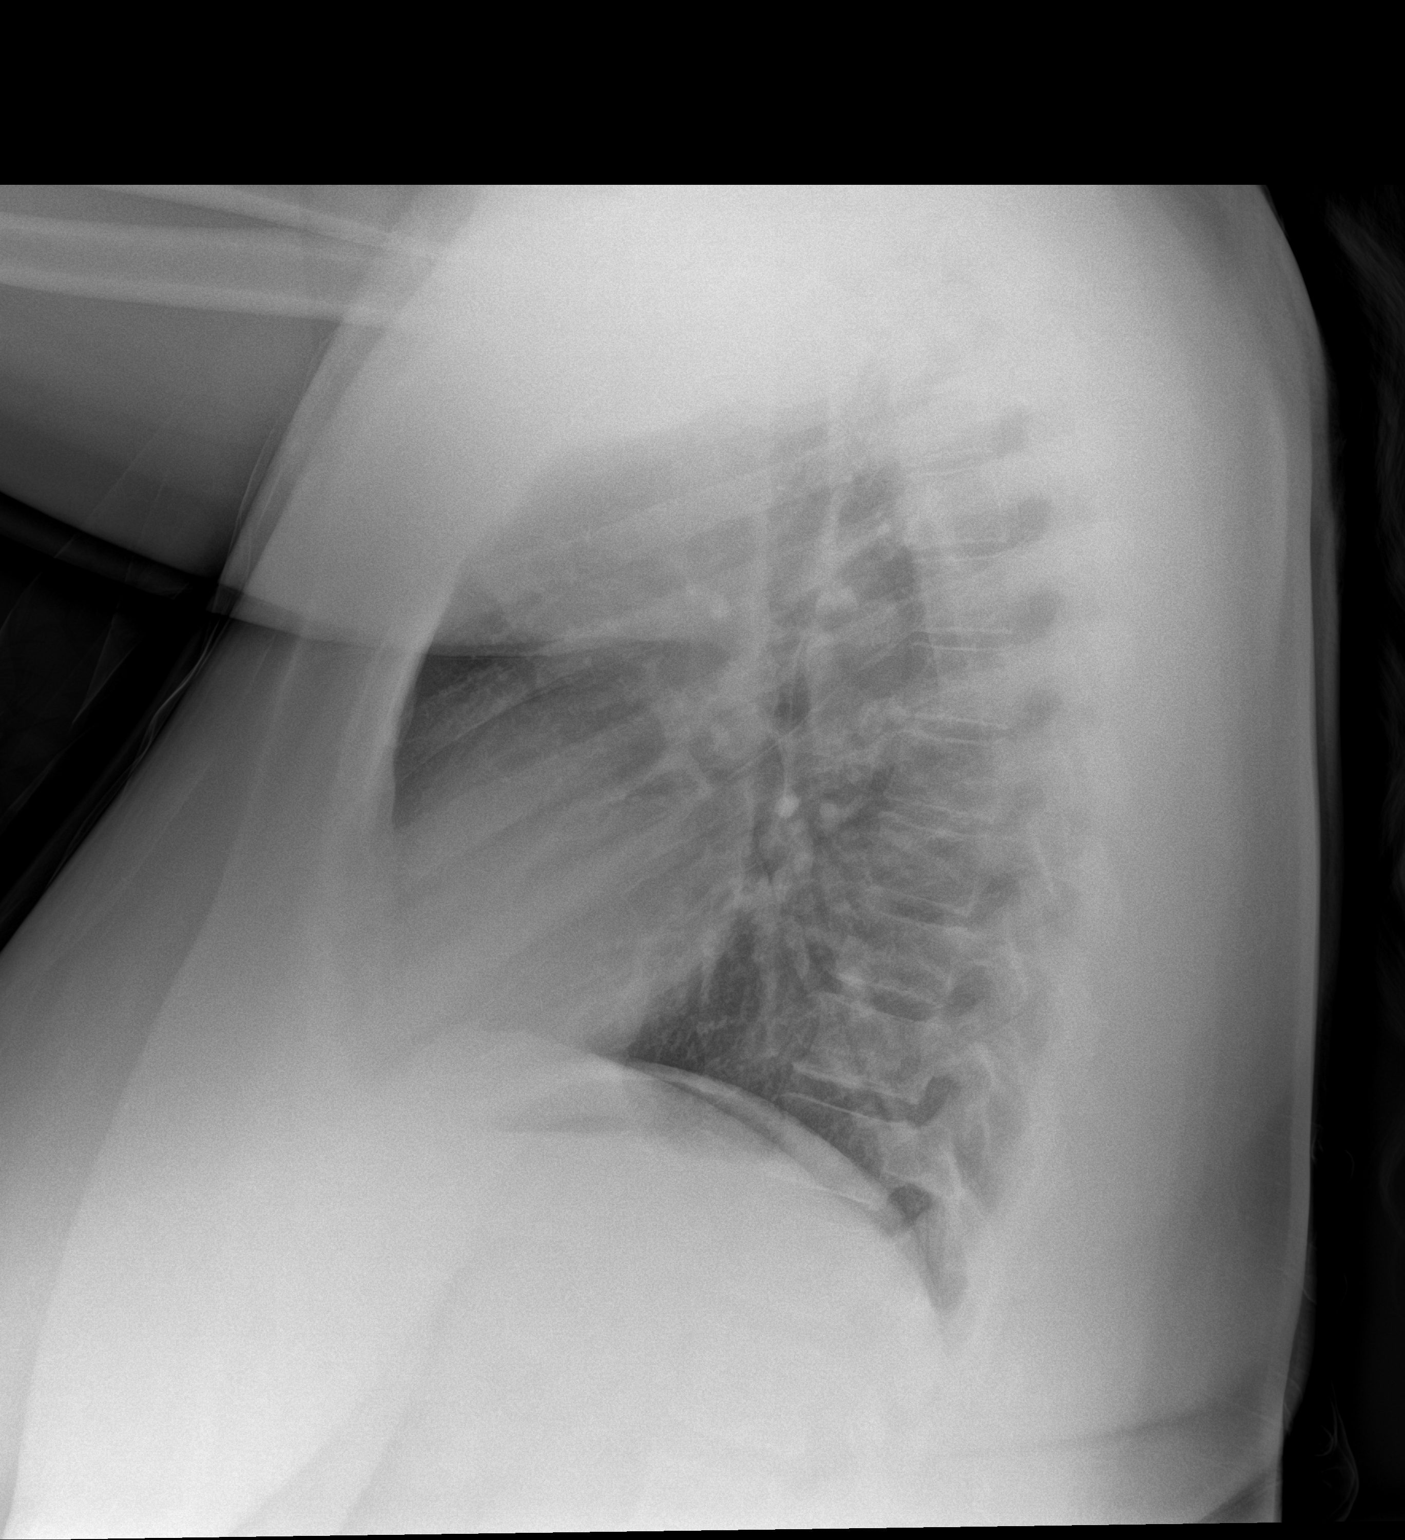

[2 of 2 positions shown; findings below may reference images not displayed]

FINDINGS: The heart size and mediastinal contours are within normal limits.
Both lungs are clear. The visualized skeletal structures are
unremarkable.
IMPRESSION: No active cardiopulmonary disease.

## 2023-04-21 ENCOUNTER — Telehealth: Payer: Self-pay | Admitting: Podiatry

## 2023-04-21 NOTE — Telephone Encounter (Signed)
Called and left a voice message for patient to call and reschedule appointment tomorrow to a later time due to the weather.

## 2023-04-22 ENCOUNTER — Ambulatory Visit: Payer: No Typology Code available for payment source | Admitting: Podiatry

## 2023-04-29 ENCOUNTER — Ambulatory Visit (INDEPENDENT_AMBULATORY_CARE_PROVIDER_SITE_OTHER): Payer: No Typology Code available for payment source

## 2023-04-29 ENCOUNTER — Encounter: Payer: Self-pay | Admitting: Podiatry

## 2023-04-29 ENCOUNTER — Ambulatory Visit (INDEPENDENT_AMBULATORY_CARE_PROVIDER_SITE_OTHER): Payer: No Typology Code available for payment source | Admitting: Podiatry

## 2023-04-29 DIAGNOSIS — M216X1 Other acquired deformities of right foot: Secondary | ICD-10-CM

## 2023-04-29 DIAGNOSIS — M2142 Flat foot [pes planus] (acquired), left foot: Secondary | ICD-10-CM

## 2023-04-29 DIAGNOSIS — M2141 Flat foot [pes planus] (acquired), right foot: Secondary | ICD-10-CM

## 2023-04-29 DIAGNOSIS — M79672 Pain in left foot: Secondary | ICD-10-CM | POA: Diagnosis not present

## 2023-04-29 DIAGNOSIS — M79671 Pain in right foot: Secondary | ICD-10-CM | POA: Diagnosis not present

## 2023-04-29 DIAGNOSIS — M216X2 Other acquired deformities of left foot: Secondary | ICD-10-CM | POA: Diagnosis not present

## 2023-04-29 MED ORDER — METHYLPREDNISOLONE 4 MG PO TBPK: ORAL_TABLET | ORAL | 0 refills | Status: AC

## 2023-04-29 MED ORDER — MELOXICAM 15 MG PO TABS: 15.0000 mg | ORAL_TABLET | Freq: Every day | ORAL | 0 refills | Status: DC

## 2023-04-29 MED ORDER — TRIAMCINOLONE ACETONIDE 10 MG/ML IJ SUSP
10.0000 mg | Freq: Once | INTRAMUSCULAR | Status: AC
  Administered 2023-04-29: 10 mg

## 2023-04-29 NOTE — Progress Notes (Unsigned)
 Chief Complaint  Patient presents with   Plantar Fasciitis    Patient states that the pain in bilateral feet started about a year ago and that the pain would started from the arch in bilateral feet and go up to her bilateral heels and ankle. Patient has not been taking medication for pain but her PCP told her that it was plantar fasciitis and she tired to roll her foot on a ball and it did not help take pain away. The pain is sharp and it started in the left foot but now it is the right foot that is hurting more .    HPI: 33 y.o. female presents today with pain to both feet.  States that this is been going on for close to a year at this point.  Had previously been told by PCP that she may have plantar fasciitis.  Patient Dors is most of her pain at the level of the plantar medial arch and along PT tendon.  States that she works out frequently and the feet are painful with all weightbearing activity.  Past Medical History:  Diagnosis Date   Stress fracture of foot 2012   left; healed with use of boot, no cast    Past Surgical History:  Procedure Laterality Date   WISDOM TOOTH EXTRACTION      No Known Allergies  ROS    Physical Exam: There were no vitals filed for this visit.  General: The patient is alert and oriented x3 in no acute distress.  Dermatology: Skin is warm, dry and supple bilateral lower extremities. Interspaces are clear of maceration and debris.    Vascular: Palpable pedal pulses bilaterally. Capillary refill within normal limits.  No appreciable edema.  No erythema or calor.  Neurological: Light touch sensation grossly intact bilateral feet.   Musculoskeletal Exam: Pes planus foot type noted.  Heels reducible to varus with double heel rise. Tenderness on palpation of PT tendon and at its insertion of navicular tuberosity on both feet.  Decreased ankle joint dorsiflexion noted less than 10 degrees past neutral with knees extended, does improve with knees  flexed.  Radiographic Exam: Left and right foot 04/29/2023 Normal osseous mineralization. Joint spaces preserved.  No fractures or osseous irregularities noted.  Pes planus foot type appreciated bilaterally with talar head uncoverage.  There is some TN spurring left foot.  Assessment/Plan of Care: 1. Equinus deformity of both feet   2. Pes planus of both feet      Meds ordered this encounter  Medications      methylPREDNISolone (MEDROL DOSEPAK) 4 MG TBPK tablet    Sig: 6 Day Tapering Dose    Dispense:  21 tablet    Refill:  0   meloxicam (MOBIC) 15 MG tablet    Sig: Take 1 tablet (15 mg total) by mouth daily.    Dispense:  21 tablet    Refill:  0   None  Discussed clinical findings with patient today.  Radiographs reviewed with patient.  Discussed the etiology, pathomechanics and treatment options in detail with the patient, we also reviewed today's radiographs in detail.  We discussed how pes planus deformity without pain or functional limitation is quite common and often does not require any treatment.  However when pain or functional limitation arises, treatment with nonsurgical therapy is our first line with stretching, physical therapy, and supportive orthoses.    We also discussed the impact of the deformity on soft tissue and the  joints and development of arthritis over time.  Today I recommended use of good over-the-counter inserts.  Power steps dispensed to patient.  Discussed good supportive shoe gear.  Will start patient on Medrol Dosepak.  Upon completion she can start taking oral meloxicam 15 mg daily.  Did discuss that in some cases this may require periods of immobilization or physical therapy.  Stretching and rehab instructions dispensed to patient today.  Follow-up in 3 weeks.    Sameer Teeple L. Marchia Bond, AACFAS [facility]     [address] [place], [place] [address]                Office [phone]  Fax [phone]

## 2023-05-17 ENCOUNTER — Other Ambulatory Visit: Payer: Self-pay | Admitting: Podiatry

## 2023-05-17 DIAGNOSIS — M2141 Flat foot [pes planus] (acquired), right foot: Secondary | ICD-10-CM

## 2023-05-20 ENCOUNTER — Ambulatory Visit (INDEPENDENT_AMBULATORY_CARE_PROVIDER_SITE_OTHER): Payer: No Typology Code available for payment source | Admitting: Podiatry

## 2023-05-20 ENCOUNTER — Encounter: Payer: Self-pay | Admitting: Podiatry

## 2023-05-20 DIAGNOSIS — M2141 Flat foot [pes planus] (acquired), right foot: Secondary | ICD-10-CM

## 2023-05-20 DIAGNOSIS — M76821 Posterior tibial tendinitis, right leg: Secondary | ICD-10-CM | POA: Diagnosis not present

## 2023-05-20 DIAGNOSIS — M2142 Flat foot [pes planus] (acquired), left foot: Secondary | ICD-10-CM

## 2023-05-20 DIAGNOSIS — M76822 Posterior tibial tendinitis, left leg: Secondary | ICD-10-CM

## 2023-05-20 NOTE — Progress Notes (Signed)
       Chief Complaint  Patient presents with   Foot Pain    rm 16: Return in about 3 weeks (around 05/20/2023) for Pes planus, PTTD. Pt states pain is much better with insoles. She went walking and states that it was much better than before. She does notice pain on the right foot inner aspect when not wearing proper shoes and insoles.    HPI: 33 y.o. female presents today for follow-up evaluation of bilateral PTTD.  She has been doing great with the over-the-counter inserts.  She has not had any significant pain at this point.  He does still notice some soreness present without good shoes so is not using inserts.  Past Medical History:  Diagnosis Date   Stress fracture of foot 2012   left; healed with use of boot, no cast    Past Surgical History:  Procedure Laterality Date   WISDOM TOOTH EXTRACTION      No Known Allergies  ROS    Physical Exam: There were no vitals filed for this visit.  General: The patient is alert and oriented x3 in no acute distress.  Dermatology: Skin is warm, dry and supple bilateral lower extremities. Interspaces are clear of maceration and debris.    Vascular: Palpable pedal pulses bilaterally. Capillary refill within normal limits.  No appreciable edema.  No erythema or calor.  Neurological: Light touch sensation grossly intact bilateral feet.   Musculoskeletal Exam: Pes planus foot type noted.  Heels reducible to varus with double heel rise.  Decreased ankle joint dorsiflexion noted less than 10 degrees past neutral with knees extended, does improve with knees flexed.  No significant tenderness on palpation of PT tendon or navicular tuberosity today  Radiographic Exam: Left and right foot 04/29/2023 Normal osseous mineralization. Joint spaces preserved.  No fractures or osseous irregularities noted.  Pes planus foot type appreciated bilaterally with talar head uncoverage.  There is some TN spurring left foot.  Assessment/Plan of Care: 1. Pes  planus of both feet   2. Posterior tibial tendon dysfunction (PTTD) of both lower extremities                                            None  Discussed clinical findings with patient today.   She has been doing well with over-the-counter inserts and wearing good shoe gear.  Stretching regimen and RICE protocol as needed.  Can continue with course of oral meloxicam.  Discussed custom orthotics today.  He can follow-up as needed if she would like to proceed with this may contact me to put the order in.  Follow-up as needed if she has any recurrence or flareups.  Taylorann Tkach L. Marchia Bond, AACFAS [facility]     [address] [place], [place] [address]                Office [phone]  Fax [phone]

## 2023-06-15 ENCOUNTER — Other Ambulatory Visit: Payer: Self-pay

## 2023-06-15 ENCOUNTER — Emergency Department (HOSPITAL_BASED_OUTPATIENT_CLINIC_OR_DEPARTMENT_OTHER)

## 2023-06-15 ENCOUNTER — Emergency Department (HOSPITAL_BASED_OUTPATIENT_CLINIC_OR_DEPARTMENT_OTHER)
Admission: EM | Admit: 2023-06-15 | Discharge: 2023-06-15 | Disposition: A | Discharge status: Home / Self Care | Attending: Emergency Medicine | Admitting: Emergency Medicine

## 2023-06-15 ENCOUNTER — Encounter (HOSPITAL_BASED_OUTPATIENT_CLINIC_OR_DEPARTMENT_OTHER): Payer: Self-pay

## 2023-06-15 DIAGNOSIS — O208 Other hemorrhage in early pregnancy: Secondary | ICD-10-CM

## 2023-06-15 DIAGNOSIS — R040 Epistaxis: Secondary | ICD-10-CM | POA: Diagnosis not present

## 2023-06-15 DIAGNOSIS — O209 Hemorrhage in early pregnancy, unspecified: Secondary | ICD-10-CM | POA: Diagnosis not present

## 2023-06-15 DIAGNOSIS — S30811A Abrasion of abdominal wall, initial encounter: Secondary | ICD-10-CM | POA: Insufficient documentation

## 2023-06-15 DIAGNOSIS — Z3A01 Less than 8 weeks gestation of pregnancy: Secondary | ICD-10-CM | POA: Diagnosis not present

## 2023-06-15 DIAGNOSIS — O26891 Other specified pregnancy related conditions, first trimester: Secondary | ICD-10-CM | POA: Diagnosis present

## 2023-06-15 DIAGNOSIS — O9A211 Injury, poisoning and certain other consequences of external causes complicating pregnancy, first trimester: Secondary | ICD-10-CM | POA: Insufficient documentation

## 2023-06-15 LAB — URINALYSIS, ROUTINE W REFLEX MICROSCOPIC
Bilirubin Urine: NEGATIVE
Glucose, UA: NEGATIVE mg/dL
Ketones, ur: NEGATIVE mg/dL
Leukocytes,Ua: NEGATIVE
Nitrite: NEGATIVE
Protein, ur: NEGATIVE mg/dL
Specific Gravity, Urine: 1.009 (ref 1.005–1.030)
pH: 5.5 (ref 5.0–8.0)

## 2023-06-15 LAB — CBC WITH DIFFERENTIAL/PLATELET
Abs Immature Granulocytes: 0.02 10*3/uL (ref 0.00–0.07)
Basophils Absolute: 0 10*3/uL (ref 0.0–0.1)
Basophils Relative: 0 %
Eosinophils Absolute: 0.1 10*3/uL (ref 0.0–0.5)
Eosinophils Relative: 1 %
HCT: 37 % (ref 36.0–46.0)
Hemoglobin: 13.1 g/dL (ref 12.0–15.0)
Immature Granulocytes: 0 %
Lymphocytes Relative: 16 %
Lymphs Abs: 1.1 10*3/uL (ref 0.7–4.0)
MCH: 30.7 pg (ref 26.0–34.0)
MCHC: 35.4 g/dL (ref 30.0–36.0)
MCV: 86.7 fL (ref 80.0–100.0)
Monocytes Absolute: 0.5 10*3/uL (ref 0.1–1.0)
Monocytes Relative: 7 %
Neutro Abs: 5.5 10*3/uL (ref 1.7–7.7)
Neutrophils Relative %: 76 %
Platelets: 264 10*3/uL (ref 150–400)
RBC: 4.27 MIL/uL (ref 3.87–5.11)
RDW: 13 % (ref 11.5–15.5)
WBC: 7.2 10*3/uL (ref 4.0–10.5)
nRBC: 0 % (ref 0.0–0.2)

## 2023-06-15 LAB — COMPREHENSIVE METABOLIC PANEL WITH GFR
ALT: 15 U/L (ref 0–44)
AST: 16 U/L (ref 15–41)
Albumin: 4.4 g/dL (ref 3.5–5.0)
Alkaline Phosphatase: 58 U/L (ref 38–126)
Anion gap: 8 (ref 5–15)
BUN: 9 mg/dL (ref 6–20)
CO2: 24 mmol/L (ref 22–32)
Calcium: 9.3 mg/dL (ref 8.9–10.3)
Chloride: 103 mmol/L (ref 98–111)
Creatinine, Ser: 0.85 mg/dL (ref 0.44–1.00)
GFR, Estimated: >60 mL/min (ref >60)
Glucose, Bld: 114 mg/dL — ABNORMAL HIGH (ref 70–99)
Potassium: 3.5 mmol/L (ref 3.5–5.1)
Sodium: 135 mmol/L (ref 135–145)
Total Bilirubin: 0.6 mg/dL (ref 0.0–1.2)
Total Protein: 7.4 g/dL (ref 6.5–8.1)

## 2023-06-15 LAB — HCG, QUANTITATIVE, PREGNANCY: hCG, Beta Chain, Quant, S: 8622 m[IU]/mL — ABNORMAL HIGH (ref <5)
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
## 2023-06-15 NOTE — Discharge Instructions (Addendum)
 Follow up with your OB for recheck. Follow up with ENT if nose pain persists. Please call to schedule an appointment. Apply ice for 20 minutes at a time.   Go to [facility] for any pregnancy related concerns.
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
